# Patient Record
Sex: Female | Born: 1985 | Race: White | Hispanic: No | Marital: Single | Smoking: Never smoker
Health system: Southern US, Community
[De-identification: ages and names within clinical notes are randomized; demographics above are authoritative.]

## PROBLEM LIST (undated history)

## (undated) DIAGNOSIS — D509 Iron deficiency anemia, unspecified: Secondary | ICD-10-CM

## (undated) DIAGNOSIS — F410 Panic disorder [episodic paroxysmal anxiety] without agoraphobia: Secondary | ICD-10-CM

## (undated) DIAGNOSIS — F329 Major depressive disorder, single episode, unspecified: Secondary | ICD-10-CM

## (undated) DIAGNOSIS — B977 Papillomavirus as the cause of diseases classified elsewhere: Secondary | ICD-10-CM

## (undated) HISTORY — DX: Major depressive disorder, single episode, unspecified: F32.9

## (undated) HISTORY — DX: Iron deficiency anemia, unspecified: D50.9

## (undated) HISTORY — DX: Panic disorder (episodic paroxysmal anxiety): F41.0

## (undated) HISTORY — PX: MOUTH SURGERY: SHX715

## (undated) HISTORY — PX: WISDOM TOOTH EXTRACTION: SHX21

## (undated) HISTORY — DX: Papillomavirus as the cause of diseases classified elsewhere: B97.7

---
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
## 2004-01-29 ENCOUNTER — Other Ambulatory Visit: Admission: RE | Admit: 2004-01-29 | Discharge: 2004-01-29 | Payer: Self-pay | Admitting: Gynecology

## 2005-03-03 ENCOUNTER — Other Ambulatory Visit: Admission: RE | Admit: 2005-03-03 | Discharge: 2005-03-03 | Payer: Self-pay | Admitting: Gynecology

## 2006-03-10 ENCOUNTER — Other Ambulatory Visit: Admission: RE | Admit: 2006-03-10 | Discharge: 2006-03-10 | Payer: Self-pay | Admitting: Gynecology

## 2008-12-23 LAB — CONVERTED CEMR LAB: Pap Smear: ABNORMAL

## 2009-03-05 ENCOUNTER — Ambulatory Visit: Payer: Self-pay | Admitting: Gynecology

## 2009-03-05 ENCOUNTER — Encounter: Payer: Self-pay | Admitting: Gynecology

## 2009-03-05 ENCOUNTER — Other Ambulatory Visit: Admission: RE | Admit: 2009-03-05 | Discharge: 2009-03-05 | Payer: Self-pay | Admitting: Gynecology

## 2009-03-19 ENCOUNTER — Ambulatory Visit: Payer: Self-pay | Admitting: Gynecology

## 2009-07-31 ENCOUNTER — Ambulatory Visit: Payer: Self-pay | Admitting: Obstetrics and Gynecology

## 2009-08-12 ENCOUNTER — Ambulatory Visit: Payer: Self-pay | Admitting: Obstetrics and Gynecology

## 2009-10-23 ENCOUNTER — Ambulatory Visit: Payer: Self-pay | Admitting: Family Medicine

## 2009-10-23 DIAGNOSIS — F410 Panic disorder [episodic paroxysmal anxiety] without agoraphobia: Secondary | ICD-10-CM

## 2009-10-23 DIAGNOSIS — D509 Iron deficiency anemia, unspecified: Secondary | ICD-10-CM

## 2009-10-23 HISTORY — DX: Panic disorder (episodic paroxysmal anxiety): F41.0

## 2009-10-23 HISTORY — DX: Iron deficiency anemia, unspecified: D50.9

## 2009-12-24 ENCOUNTER — Ambulatory Visit: Payer: Self-pay | Admitting: Family Medicine

## 2009-12-24 LAB — CONVERTED CEMR LAB
AST: 15 units/L (ref 0–37)
Albumin: 4.1 g/dL (ref 3.5–5.2)
Alkaline Phosphatase: 37 units/L — ABNORMAL LOW (ref 39–117)
Basophils Absolute: 0 10*3/uL (ref 0.0–0.1)
Basophils Relative: 0.6 % (ref 0.0–3.0)
CO2: 28 meq/L (ref 19–32)
Eosinophils Absolute: 0.1 10*3/uL (ref 0.0–0.7)
Glucose, Bld: 74 mg/dL (ref 70–99)
HCT: 37.3 % (ref 36.0–46.0)
HDL: 65.4 mg/dL (ref >39.00)
Hemoglobin: 12.4 g/dL (ref 12.0–15.0)
Leukocytes, UA: NEGATIVE
Lymphocytes Relative: 31.1 % (ref 12.0–46.0)
Lymphs Abs: 1.3 10*3/uL (ref 0.7–4.0)
MCHC: 33.2 g/dL (ref 30.0–36.0)
Monocytes Relative: 8.7 % (ref 3.0–12.0)
Neutro Abs: 2.3 10*3/uL (ref 1.4–7.7)
Nitrite: NEGATIVE
Potassium: 4.3 meq/L (ref 3.5–5.1)
RBC: 4.02 M/uL (ref 3.87–5.11)
RDW: 11.8 % (ref 11.5–14.6)
Sodium: 139 meq/L (ref 135–145)
Specific Gravity, Urine: 1.02 (ref 1.000–1.030)
TSH: 1.02 microintl units/mL (ref 0.35–5.50)
Total CHOL/HDL Ratio: 2
Total Protein, Urine: NEGATIVE mg/dL
Total Protein: 6.7 g/dL (ref 6.0–8.3)
pH: 6 (ref 5.0–8.0)

## 2009-12-30 ENCOUNTER — Ambulatory Visit: Payer: Self-pay | Admitting: Family Medicine

## 2009-12-31 LAB — CONVERTED CEMR LAB: Hep B S Ab: POSITIVE — AB

## 2010-06-25 LAB — CONVERTED CEMR LAB: Pap Smear: NORMAL

## 2010-07-08 ENCOUNTER — Ambulatory Visit: Payer: Self-pay | Admitting: Family Medicine

## 2010-07-08 DIAGNOSIS — F329 Major depressive disorder, single episode, unspecified: Secondary | ICD-10-CM

## 2010-07-08 DIAGNOSIS — F3289 Other specified depressive episodes: Secondary | ICD-10-CM

## 2010-07-08 DIAGNOSIS — F4321 Adjustment disorder with depressed mood: Secondary | ICD-10-CM | POA: Insufficient documentation

## 2010-07-08 HISTORY — DX: Other specified depressive episodes: F32.89

## 2010-07-08 HISTORY — DX: Major depressive disorder, single episode, unspecified: F32.9

## 2010-08-11 ENCOUNTER — Ambulatory Visit: Payer: Self-pay | Admitting: Family Medicine

## 2010-11-06 ENCOUNTER — Ambulatory Visit
Admission: RE | Admit: 2010-11-06 | Discharge: 2010-11-06 | Payer: Self-pay | Source: Home / Self Care | Attending: Family Medicine | Admitting: Family Medicine

## 2010-11-06 DIAGNOSIS — J209 Acute bronchitis, unspecified: Secondary | ICD-10-CM | POA: Insufficient documentation

## 2010-11-24 NOTE — Assessment & Plan Note (Signed)
Summary: ?bronchitis//ccm pt rsc appt time/njr   Vital Signs:  Patient profile:   25 year old female Menstrual status:  regular Temp:     98.8 degrees F oral BP sitting:   128 / 78  (left arm) Cuff size:   regular  Vitals Entered By: Sid Falcon LPN (July 08, 2010 9:47 AM)  History of Present Illness: Patient seen for the following concerns  Acute new problem of right frontal sinus pressure with yellow to green nasal discharge and productive cough over the past week or so. Increased malaise. Intermittent headaches. No fever.  no signif cough.  History of panic disorder. Anxiety symptoms have improved with Zoloft. She is here to discuss some depressed mood daily for the past couple of months. No recent losses or other significant stressors. Work is going well. She has daily symptoms of depressed mood, low motivation, and occasional difficulty concentrating. No suicidal ideation.  Allergies (verified): No Known Drug Allergies  Past History:  Past Medical History: Last updated: 10/23/2009 Anemia-iron deficiency, hx of UTI Panic disorder PMH reviewed for relevance  Review of Systems  The patient denies anorexia, fever, weight loss, chest pain, headaches, and abdominal pain.    Physical Exam  General:  Well-developed,well-nourished,in no acute distress; alert,appropriate and cooperative throughout examination Head:  Normocephalic and atraumatic without obvious abnormalities. No apparent alopecia or balding. Ears:  External ear exam shows no significant lesions or deformities.  Otoscopic examination reveals clear canals, tympanic membranes are intact bilaterally without bulging, retraction, inflammation or discharge. Hearing is grossly normal bilaterally. Nose:  External nasal examination shows no deformity or inflammation. Nasal mucosa are pink and moist without lesions or exudates. Mouth:  Oral mucosa and oropharynx without lesions or exudates.  Teeth in good  repair. Neck:  minimally tender anterior cervical adenopathy bilaterally Lungs:  Normal respiratory effort, chest expands symmetrically. Lungs are clear to auscultation, no crackles or wheezes. Heart:  Normal rate and regular rhythm. S1 and S2 normal without gallop, murmur, click, rub or other extra sounds. Psych:  normally interactive, good eye contact, not anxious appearing, and subdued.     Impression & Recommendations:  Problem # 1:  DEPRESSION (ICD-311) add low dose Wellbutrin and follow up in one months. Her updated medication list for this problem includes:    Zoloft 100 Mg Tabs (Sertraline hcl) ..... Once daily    Bupropion Hcl 150 Mg Xr24h-tab (Bupropion hcl) ..... One by mouth once daily  Problem # 2:  SINUSITIS, ACUTE (ICD-461.9) Assessment: New  Her updated medication list for this problem includes:    Azithromycin 250 Mg Tabs (Azithromycin) .Marland Kitchen... 2 by mouth today then one by mouth once daily for 4 days  Complete Medication List: 1)  Zoloft 100 Mg Tabs (Sertraline hcl) .... Once daily 2)  Azithromycin 250 Mg Tabs (Azithromycin) .... 2 by mouth today then one by mouth once daily for 4 days 3)  Bupropion Hcl 150 Mg Xr24h-tab (Bupropion hcl) .... One by mouth once daily  Patient Instructions: 1)  Please schedule a follow-up appointment in 1 month.  Prescriptions: BUPROPION HCL 150 MG XR24H-TAB (BUPROPION HCL) one by mouth once daily  #30 x 5   Entered and Authorized by:   Evelena Peat MD   Signed by:   Evelena Peat MD on 07/08/2010   Method used:   Electronically to        [facility]  [address] [phone]* (retail)       [address]  [place], [place]  [address]       Ph: [phone] or [phone]       Fax: [phone]   RxID:   [id number] AZITHROMYCIN 250 MG TABS (AZITHROMYCIN) 2 by mouth today then one by mouth once daily for 4 days  #6 x 0   Entered and Authorized by:   Evelena Peat MD   Signed by:   Evelena Peat MD on 07/08/2010   Method  used:   [facility]        [facility]  [address] (retail)       [address]       [place], [place]  [address]       Ph: [phone] or [phone]       Fax: [phone]   RxID:   [id number] BUPROPION HCL 150 MG XR24H-TAB (BUPROPION HCL) one by mouth once daily  #30 x 5   Entered and Authorized by:   Evelena Peat MD   Signed by:   Evelena Peat MD on 07/08/2010   Method used:   [facility]        [facility]  [place] [address] (retail)       [address]       [place], [place]  [address]       Ph: [phone] or [phone]       Fax: [phone]   RxID:   [id number] AZITHROMYCIN 250 MG TABS (AZITHROMYCIN) 2 by mouth today then one by mouth once daily for 4 days  #6 x 0   Entered and Authorized by:   Evelena Peat MD   Signed by:   Evelena Peat MD on 07/08/2010   Method used:   [facility]        [facility]  [place] [address] (retail)       [address]       [place], [place]  [address]       Ph: [phone] or [phone]       Fax: [phone]   RxID:   [id number]  Pharmacy called, cancelled Rx at this location [facility] LPN  July 08, 2010 10:17 AM

## 2010-11-24 NOTE — Assessment & Plan Note (Signed)
Summary: ONE MTH ROV // RS   Vital Signs:  Patient profile:   25 year old female Menstrual status:  regular Weight:      133 pounds Temp:     98.3 degrees F oral BP sitting:   110 / 78  (left arm) Cuff size:   regular  Vitals Entered By: Sid Falcon LPN (August 11, 2010 8:43 AM)  History of Present Illness: Follow up depression.  Improved on Wellbutrin. Outlook and mood improved.  Sleep OK.  No side effects from med. More energy. Sinus infection improved from last visit.  Allergies (verified): No Known Drug Allergies  Past History:  Past Medical History: Last updated: 10/23/2009 Anemia-iron deficiency, hx of UTI Panic disorder PMH reviewed for relevance  Review of Systems      See HPI  Physical Exam  General:  Well-developed,well-nourished,in no acute distress; alert,appropriate and cooperative throughout examination Mouth:  Oral mucosa and oropharynx without lesions or exudates.  Teeth in good repair. Neck:  No deformities, masses, or tenderness noted. Lungs:  Normal respiratory effort, chest expands symmetrically. Lungs are clear to auscultation, no crackles or wheezes. Heart:  Normal rate and regular rhythm. S1 and S2 normal without gallop, murmur, click, rub or other extra sounds. Psych:  normally interactive, good eye contact, not anxious appearing, and not depressed appearing.     Impression & Recommendations:  Problem # 1:  DEPRESSION (ICD-311) Assessment Improved  Her updated medication list for this problem includes:    Zoloft 100 Mg Tabs (Sertraline hcl) ..... Once daily    Bupropion Hcl 150 Mg Xr24h-tab (Bupropion hcl) ..... One by mouth once daily  Complete Medication List: 1)  Zoloft 100 Mg Tabs (Sertraline hcl) .... Once daily 2)  Bupropion Hcl 150 Mg Xr24h-tab (Bupropion hcl) .... One by mouth once daily  Other Orders: Admin 1st Vaccine (04540) Flu Vaccine 49yrs + (98119)  Patient Instructions: 1)  Please schedule a follow-up appointment  in 6 months .    Orders Added: 1)  Est. Patient Level III [14782] 2)  Admin 1st Vaccine [90471] 3)  Flu Vaccine 31yrs + [95621]   Flu Vaccine Consent Questions     Do you have a history of severe allergic reactions to this vaccine? no    Any prior history of allergic reactions to egg and/or gelatin? no    Do you have a sensitivity to the preservative Thimersol? no    Do you have a past history of Guillan-Barre Syndrome? no    Do you currently have an acute febrile illness? no    Have you ever had a severe reaction to latex? no    Vaccine information given and explained to patient? yes    Are you currently pregnant? no    Lot Number:[id number]   Exp Date:04/24/2011   Site Given  Left Deltoid IM        .[email or web address]

## 2010-11-24 NOTE — Assessment & Plan Note (Signed)
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  Patient has no significant chronic medical problems. She has had prior history of panic disorder treated with Zoloft and well-controlled. Prior history of iron deficiency anemia. Patient has had PPD within the past year negative. Last tetanus 2005. She has received 2 doses of measles mumps rubella. Past history of clinical varicella. Flu shot earlier this year. Hepatitis B series in school but no shot record.  She gets GYN checkups elsewhere. No complaints today. Family history social history reviewed and no significant changes  Preventive Screening-Counseling & Management  Alcohol-Tobacco     Smoking Status: never  Allergies (verified): No Known Drug Allergies  Past History:  Past Medical History: Last updated: 10/23/2009 Anemia-iron deficiency, hx of UTI Panic disorder  Family History: Last updated: 10/23/2009 Family History of Arthritis  Mother RA Colon cancer, grandfather Breast cancer, grandmother hypertension, both grandparents Leukemia, grandfather  Social History: Last updated: 10/23/2009 Occupation:  [employer] Never Smoked Alcohol use-yes Regular exercise-yes  Risk Factors: Exercise: yes (10/23/2009)  Risk Factors: Smoking Status: never (12/30/2009)  Review of Systems  The patient denies anorexia, fever, weight loss, weight gain, vision loss, decreased hearing, hoarseness, chest  pain, syncope, dyspnea on exertion, peripheral edema, prolonged cough, headaches, hemoptysis, abdominal pain, melena, hematochezia, severe indigestion/heartburn, hematuria, incontinence, genital sores, muscle weakness, suspicious skin lesions, difficulty walking, depression, unusual weight change, abnormal bleeding, enlarged lymph nodes, and breast masses.    Physical Exam  General:  Well-developed,well-nourished,in no acute distress; alert,appropriate and cooperative throughout examination Head:  Normocephalic and atraumatic without obvious abnormalities. No apparent alopecia or balding. Eyes:  No corneal or conjunctival inflammation noted. EOMI. Perrla. Funduscopic exam benign, without hemorrhages, exudates or papilledema. Vision grossly normal. Ears:  External ear exam shows no significant lesions or deformities.  Otoscopic examination reveals clear canals, tympanic membranes are intact bilaterally without bulging, retraction, inflammation or discharge. Hearing is grossly normal bilaterally. Mouth:  Oral mucosa and oropharynx without lesions or exudates.  Teeth in good repair. Neck:  No deformities, masses, or tenderness noted. Lungs:  Normal respiratory effort, chest expands symmetrically. Lungs are clear to auscultation, no crackles or wheezes. Heart:  Normal rate and regular rhythm. S1 and S2 normal without gallop, murmur, click, rub or other extra sounds. Abdomen:  Bowel sounds positive,abdomen soft and non-tender without masses, organomegaly or hernias noted. Extremities:  No clubbing, cyanosis, edema, or deformity noted with normal full range of motion of all joints.   Neurologic:  No cranial nerve deficits noted. Station and gait are normal. Plantar reflexes are down-going bilaterally. DTRs are symmetrical throughout. Sensory, motor and coordinative functions appear intact. Skin:  Intact without suspicious lesions or rashes Cervical Nodes:  No lymphadenopathy noted Psych:  Oriented X3,  normally interactive, good eye contact, not anxious appearing, and not depressed appearing.  Impression & Recommendations:  Problem # 1:  Preventive Health Care (ICD-V70.0) patient needs proof of hepatitis B vaccination and will obtain hepatitis B surface antibody level.  Labs reviewed with pt and all OK.  discussed regular exercise.  Complete Medication List: 1)  Zoloft 100 Mg Tabs (Sertraline hcl) .... Once daily  Other Orders: T-Hepatitis B Surface Antibody ([id number]) Venipuncture (62952)

## 2010-11-26 NOTE — Assessment & Plan Note (Signed)
Summary: congestion//ccm   Vital Signs:  Patient profile:   25 year old female Menstrual status:  regular Weight:      134 pounds Temp:     98.0 degrees F oral BP sitting:   102 / 68  (left arm) Cuff size:   regular  Vitals Entered By: Sid Falcon LPN (November 06, 2010 2:55 PM)  History of Present Illness:  Cough      This is a 25 year old woman who presents with Cough.  The patient reports productive cough, but denies pleuritic chest pain, shortness of breath, wheezing, exertional dyspnea, fever, and hemoptysis.  Associated symtpoms include cold/URI symptoms.  The patient denies the following symptoms: sore throat, weight loss, acid reflux symptoms, and peripheral edema.    Allergies (verified): No Known Drug Allergies  Past History:  Past Medical History: Last updated: 10/23/2009 Anemia-iron deficiency, hx of UTI Panic disorder  Physical Exam  General:  Well-developed,well-nourished,in no acute distress; alert,appropriate and cooperative throughout examination Ears:  External ear exam shows no significant lesions or deformities.  Otoscopic examination reveals clear canals, tympanic membranes are intact bilaterally without bulging, retraction, inflammation or discharge. Hearing is grossly normal bilaterally. Mouth:  Oral mucosa and oropharynx without lesions or exudates.  Teeth in good repair. Neck:  No deformities, masses, or tenderness noted. Lungs:  Normal respiratory effort, chest expands symmetrically. Lungs are clear to auscultation, no crackles or wheezes. Heart:  Normal rate and regular rhythm. S1 and S2 normal without gallop, murmur, click, rub or other extra sounds.   Impression & Recommendations:  Problem # 1:  ACUTE BRONCHITIS (ICD-466.0) explained likely viral.  only start antibioitc if she develops fever or worsening symtpsm Her updated medication list for this problem includes:    Azithromycin 250 Mg Tabs (Azithromycin) .Marland Kitchen... 2 by mouth today then one by  mouth today then one by mouth once daily for 4 days  Complete Medication List: 1)  Zoloft 100 Mg Tabs (Sertraline hcl) .... Once daily 2)  Bupropion Hcl 150 Mg Xr24h-tab (Bupropion hcl) .... One by mouth once daily 3)  Nuvaring 0.12-0.015 Mg/24hr Ring (Etonogestrel-ethinyl estradiol) .... As directed 4)  Azithromycin 250 Mg Tabs (Azithromycin) .... 2 by mouth today then one by mouth today then one by mouth once daily for 4 days  Patient Instructions: 1)  Acute Bronchitis symptoms for less then 10 days are not  helped by antibiotics. Take over the counter cough medications. Call if no improvement in 5-7 days, sooner if increasing cough, fever, or new symptoms ( shortness of breath, chest pain) .  Prescriptions: AZITHROMYCIN 250 MG TABS (AZITHROMYCIN) 2 by mouth today then one by mouth today then one by mouth once daily for 4 days  #6 x 0   Entered and Authorized by:   Evelena Peat MD   Signed by:   Evelena Peat MD on 11/06/2010   Method used:   Print then Give to Patient   RxID:   [id number]    Orders Added: 1)  Est. Patient Level III [14782]    Preventive Care Screening  Pap Smear:    Date:  06/25/2010    Results:  normal

## 2011-02-10 ENCOUNTER — Encounter: Payer: Self-pay | Admitting: Family Medicine

## 2011-02-11 ENCOUNTER — Telehealth: Payer: Self-pay | Admitting: *Deleted

## 2011-02-11 ENCOUNTER — Ambulatory Visit: Payer: Self-pay | Admitting: Family Medicine

## 2011-02-11 DIAGNOSIS — Z0289 Encounter for other administrative examinations: Secondary | ICD-10-CM

## 2011-02-11 NOTE — Telephone Encounter (Signed)
No Show for 6 month F/U, LMTCB on cell to explain reason, reminded of $50 no show fee

## 2011-02-11 NOTE — Telephone Encounter (Signed)
No charge if valid explanation.

## 2011-02-16 NOTE — Telephone Encounter (Signed)
I have no record of pt call ing back with response yet

## 2011-05-12 ENCOUNTER — Other Ambulatory Visit: Payer: Self-pay | Admitting: Family Medicine

## 2011-06-16 ENCOUNTER — Encounter: Payer: Self-pay | Admitting: Internal Medicine

## 2011-06-16 ENCOUNTER — Ambulatory Visit (INDEPENDENT_AMBULATORY_CARE_PROVIDER_SITE_OTHER): Payer: PRIVATE HEALTH INSURANCE | Admitting: Internal Medicine

## 2011-06-16 VITALS — BP 102/68 | Temp 98.3°F | Wt 145.0 lb

## 2011-06-16 DIAGNOSIS — N390 Urinary tract infection, site not specified: Secondary | ICD-10-CM | POA: Insufficient documentation

## 2011-06-16 DIAGNOSIS — R300 Dysuria: Secondary | ICD-10-CM

## 2011-06-16 LAB — POCT URINALYSIS DIPSTICK
Blood, UA: NEGATIVE
Nitrite, UA: NEGATIVE
Spec Grav, UA: 1.025
Urobilinogen, UA: 0.2
pH, UA: 6

## 2011-06-16 MED ORDER — CEFUROXIME AXETIL 500 MG PO TABS: 500.0000 mg | ORAL_TABLET | Freq: Two times a day (BID) | ORAL | Status: AC

## 2011-06-16 NOTE — Progress Notes (Signed)
Addended by: Kern Reap B on: 06/16/2011 02:56 PM   Modules accepted: Orders

## 2011-06-16 NOTE — Assessment & Plan Note (Signed)
No fever.  Mild abd tenderness.  Treat with ceftin 500 bid x 5 days Increase fluid intake Patient advised to call office if symptoms persist or worsen.

## 2011-06-16 NOTE — Patient Instructions (Signed)
Please increase your fluid intake Take full course of antibiotic as directed. Please call our office if your symptoms do not improve or gets worse.

## 2011-06-16 NOTE — Progress Notes (Signed)
  Subjective:    Patient ID: Kristin Parks, female    DOB: September 25, 1986, 25 y.o.   MRN: [id number]  Urinary Tract Infection  This is a new problem. The current episode started in the past 7 days. The problem occurs intermittently. The problem has been unchanged. The quality of the pain is described as burning. The pain is mild. There has been no fever. There is no history of pyelonephritis. Associated symptoms include frequency and urgency. Pertinent negatives include no chills or possible pregnancy. She has tried increased fluids for the symptoms.      Review of Systems  Constitutional: Negative for chills.  Genitourinary: Positive for urgency and frequency.       Past Medical History  Diagnosis Date  . ANEMIA-IRON DEFICIENCY 10/23/2009  . DEPRESSION 07/08/2010  . PANIC DISORDER 10/23/2009    History   Social History  . Marital Status: Single    Spouse Name: N/A    Number of Children: N/A  . Years of Education: N/A   Occupational History  . Not on file.   Social History Main Topics  . Smoking status: Never Smoker   . Smokeless tobacco: Never Used  . Alcohol Use: Yes  . Drug Use:   . Sexually Active:    Other Topics Concern  . Not on file   Social History Narrative  . No narrative on file    No past surgical history on file.  Family History  Problem Relation Age of Onset  . Arthritis Mother   . Hypertension Maternal Grandmother   . Arthritis Maternal Grandmother   . Hypertension Maternal Grandfather   . Arthritis Maternal Grandfather   . Hypertension Paternal Grandmother   . Hypertension Paternal Grandfather   . Cancer Neg Hx     grandfather - colon , grandmother - breast , grandfather - leukemia    Not on File  Current Outpatient Prescriptions on File Prior to Visit  Medication Sig Dispense Refill  . etonogestrel-ethinyl estradiol (NUVARING) 0.12-0.015 MG/24HR vaginal ring Place 1 each vaginally every 28 (twenty-eight) days. Insert vaginally and leave in  place for 3 consecutive weeks, then remove for 1 week.       . sertraline (ZOLOFT) 100 MG tablet TAKE 1 TABLET BY MOUTH EVERY DAY  30 tablet  0  . buPROPion (WELLBUTRIN XL) 150 MG 24 hr tablet Take 150 mg by mouth daily.          BP 102/68  Temp(Src) 98.3 F (36.8 C) (Oral)  Wt 145 lb (65.772 kg)    Objective:   Physical Exam   Constitutional: Appears well-developed and well-nourished. No distress.  Cardiovascular: Normal rate, regular rhythm and normal heart sounds.  Exam reveals no gallop and no friction rub.   No murmur heard. Pulmonary/Chest: Effort normal and breath sounds normal.  No wheezes. No rales.  Abdominal: Soft. Mild right pelvic tenderness.        Assessment & Plan:

## 2011-06-29 ENCOUNTER — Other Ambulatory Visit: Payer: Self-pay | Admitting: Family Medicine

## 2011-07-08 ENCOUNTER — Encounter: Payer: Self-pay | Admitting: Women's Health

## 2011-07-08 ENCOUNTER — Ambulatory Visit: Payer: PRIVATE HEALTH INSURANCE | Admitting: Women's Health

## 2011-07-08 ENCOUNTER — Other Ambulatory Visit: Payer: BC Managed Care – PPO

## 2011-07-08 ENCOUNTER — Ambulatory Visit
Admission: RE | Admit: 2011-07-08 | Discharge: 2011-07-08 | Disposition: A | Discharge status: Home / Self Care | Payer: BC Managed Care – PPO | Source: Ambulatory Visit | Attending: Women's Health | Admitting: Women's Health

## 2011-07-08 ENCOUNTER — Ambulatory Visit (INDEPENDENT_AMBULATORY_CARE_PROVIDER_SITE_OTHER): Payer: BC Managed Care – PPO | Admitting: Women's Health

## 2011-07-08 DIAGNOSIS — B9689 Other specified bacterial agents as the cause of diseases classified elsewhere: Secondary | ICD-10-CM

## 2011-07-08 DIAGNOSIS — Z113 Encounter for screening for infections with a predominantly sexual mode of transmission: Secondary | ICD-10-CM

## 2011-07-08 DIAGNOSIS — O3400 Maternal care for unspecified congenital malformation of uterus, unspecified trimester: Secondary | ICD-10-CM

## 2011-07-08 DIAGNOSIS — O2690 Pregnancy related conditions, unspecified, unspecified trimester: Secondary | ICD-10-CM

## 2011-07-08 DIAGNOSIS — O209 Hemorrhage in early pregnancy, unspecified: Secondary | ICD-10-CM

## 2011-07-08 DIAGNOSIS — N912 Amenorrhea, unspecified: Secondary | ICD-10-CM

## 2011-07-08 DIAGNOSIS — N760 Acute vaginitis: Secondary | ICD-10-CM

## 2011-07-08 DIAGNOSIS — O9989 Other specified diseases and conditions complicating pregnancy, childbirth and the puerperium: Secondary | ICD-10-CM

## 2011-07-08 DIAGNOSIS — A499 Bacterial infection, unspecified: Secondary | ICD-10-CM

## 2011-07-08 LAB — US OB TRANSVAGINAL

## 2011-07-08 MED ORDER — METRONIDAZOLE 250 MG PO TABS: 250.0000 mg | ORAL_TABLET | Freq: Three times a day (TID) | ORAL | Status: AC

## 2011-07-08 NOTE — Progress Notes (Signed)
  Presents with a positive home U PT, positive U PT here in the office. LMP was July 29. Started bleeding on 9/3 and has been bleeding most of this week. Abdomen is soft nontender, external genitalia is within normal limits, speculum exam moderate amount of menses type blood with an odor was noted. GC/ Chlamydia culture was taken and is pending, wet prep is positive for BV. Bimanual no CMT no adnexal fullness or tenderness uterus was small.  Threatened AB and BV.  Plan: ABO and Rh, Quant is 99,200, an ultrasound to rule out ectopic versus IUP.   Ultrasound confirms an IUP. 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 Right uterus fluid-filled sac with echogenic debris 24 x 16 gestational sac with no fetal pole noted. Left uterus fetal pole seen with fetal heart rate with a normal shaped yoke sac subchorionic hematoma seen around the sac. Will repeat the ultrasound in 10 days. Pelvic rest, Flagyl 250 by mouth 3 times a day for 7 days prescription proper use was given. Prenatal vitamin daily, healthy pregnancy behaviors were reviewed, reviewed we no longer deliver, and will transfer her care after her next ultrasound.

## 2011-07-09 LAB — ABO AND RH: Rh Type: POSITIVE

## 2011-07-20 ENCOUNTER — Encounter: Payer: PRIVATE HEALTH INSURANCE | Admitting: Gynecology

## 2011-07-23 ENCOUNTER — Ambulatory Visit: Payer: BC Managed Care – PPO | Admitting: Women's Health

## 2011-07-23 ENCOUNTER — Encounter: Payer: Self-pay | Admitting: Women's Health

## 2011-07-23 ENCOUNTER — Ambulatory Visit (INDEPENDENT_AMBULATORY_CARE_PROVIDER_SITE_OTHER): Payer: BC Managed Care – PPO

## 2011-07-23 ENCOUNTER — Telehealth: Payer: Self-pay | Admitting: *Deleted

## 2011-07-23 ENCOUNTER — Other Ambulatory Visit: Payer: BC Managed Care – PPO

## 2011-07-23 ENCOUNTER — Ambulatory Visit (INDEPENDENT_AMBULATORY_CARE_PROVIDER_SITE_OTHER): Payer: BC Managed Care – PPO | Admitting: Women's Health

## 2011-07-23 DIAGNOSIS — N939 Abnormal uterine and vaginal bleeding, unspecified: Secondary | ICD-10-CM

## 2011-07-23 DIAGNOSIS — N898 Other specified noninflammatory disorders of vagina: Secondary | ICD-10-CM

## 2011-07-23 DIAGNOSIS — R110 Nausea: Secondary | ICD-10-CM

## 2011-07-23 DIAGNOSIS — O200 Threatened abortion: Secondary | ICD-10-CM

## 2011-07-23 LAB — US OB TRANSVAGINAL

## 2011-07-23 MED ORDER — ONDANSETRON HCL 8 MG PO TABS: 8.0000 mg | ORAL_TABLET | Freq: Three times a day (TID) | ORAL | Status: AC | PRN

## 2011-07-23 NOTE — Progress Notes (Signed)
  Presents for an ultrasound, spotting for the last 2 weeks that became heavier today.Approximately [redacted] weeks gestation. She was noted to have [redacted]w[redacted]d IUP with positive fetal heart tone, normal shaped yoke sac lt side of uterus and  a fluid-filled sac with echogenic debris on right side of uterus  07/08/2011.  Ultrasound today does show a 9 week 0 day gestational sac with a normal yolk sack, fetal pole with fetal heart activity 169. S=D. No free fluid noted. Complaint of nausea, states has vomited twice today, and states feels nauseous all day long. She is on BCalm vitamins, will try Zofran 8 mg every 8 hours as needed prescription proper use was given. Bland foods encouraged, she is aware of healthy behaviors in pregnancy. Will schedule new OB care, pelvic rest encouraged, she is A+ blood type. Copy of ultrasound given to take to first appointment.Marland Kitchen

## 2011-07-23 NOTE — Telephone Encounter (Signed)
Pt called early preg, bleeding still, states was told would bleed but didn't think this long, due to losing one twin. I talked to [place] verbally and she said to do [place] today. Pt informed and scheduled. Kw

## 2011-07-28 ENCOUNTER — Other Ambulatory Visit: Payer: BC Managed Care – PPO

## 2011-07-28 ENCOUNTER — Ambulatory Visit: Payer: BC Managed Care – PPO | Admitting: Women's Health

## 2011-11-06 ENCOUNTER — Encounter (HOSPITAL_COMMUNITY): Payer: Self-pay | Admitting: *Deleted

## 2011-11-06 ENCOUNTER — Inpatient Hospital Stay (HOSPITAL_COMMUNITY): Payer: BC Managed Care – PPO

## 2011-11-06 ENCOUNTER — Inpatient Hospital Stay (HOSPITAL_COMMUNITY)
Admission: AD | Admit: 2011-11-06 | Discharge: 2011-11-22 | DRG: 650 | Disposition: A | Discharge status: Home / Self Care | Payer: BC Managed Care – PPO | Source: Ambulatory Visit | Attending: Certified Nurse Midwife | Admitting: Certified Nurse Midwife

## 2011-11-06 DIAGNOSIS — Q5128 Other doubling of uterus, other specified: Secondary | ICD-10-CM

## 2011-11-06 DIAGNOSIS — O321XX0 Maternal care for breech presentation, not applicable or unspecified: Secondary | ICD-10-CM | POA: Diagnosis present

## 2011-11-06 DIAGNOSIS — O4590 Premature separation of placenta, unspecified, unspecified trimester: Secondary | ICD-10-CM | POA: Diagnosis present

## 2011-11-06 DIAGNOSIS — O3400 Maternal care for unspecified congenital malformation of uterus, unspecified trimester: Secondary | ICD-10-CM | POA: Diagnosis present

## 2011-11-06 DIAGNOSIS — O9903 Anemia complicating the puerperium: Secondary | ICD-10-CM | POA: Diagnosis not present

## 2011-11-06 DIAGNOSIS — K5900 Constipation, unspecified: Secondary | ICD-10-CM | POA: Diagnosis present

## 2011-11-06 DIAGNOSIS — O411090 Infection of amniotic sac and membranes, unspecified, unspecified trimester, not applicable or unspecified: Secondary | ICD-10-CM | POA: Diagnosis present

## 2011-11-06 DIAGNOSIS — O4290 Premature rupture of membranes, unspecified as to length of time between rupture and onset of labor, unspecified weeks of gestation: Principal | ICD-10-CM | POA: Diagnosis present

## 2011-11-06 DIAGNOSIS — D509 Iron deficiency anemia, unspecified: Secondary | ICD-10-CM | POA: Diagnosis not present

## 2011-11-06 LAB — URINALYSIS, ROUTINE W REFLEX MICROSCOPIC
Bilirubin Urine: NEGATIVE
Glucose, UA: NEGATIVE mg/dL
Ketones, ur: NEGATIVE mg/dL
Nitrite: NEGATIVE
Protein, ur: 100 mg/dL — AB
Specific Gravity, Urine: 1.01 (ref 1.005–1.030)
Urobilinogen, UA: 0.2 mg/dL (ref 0.0–1.0)
pH: 7.5 (ref 5.0–8.0)

## 2011-11-06 LAB — DIFFERENTIAL
Basophils Absolute: 0 10*3/uL (ref 0.0–0.1)
Basophils Relative: 0 % (ref 0–1)
Eosinophils Absolute: 0.1 10*3/uL (ref 0.0–0.7)
Eosinophils Relative: 1 % (ref 0–5)
Lymphocytes Relative: 12 % (ref 12–46)
Lymphs Abs: 1.2 10*3/uL (ref 0.7–4.0)
Monocytes Absolute: 0.6 10*3/uL (ref 0.1–1.0)
Monocytes Relative: 6 % (ref 3–12)
Neutro Abs: 8 10*3/uL — ABNORMAL HIGH (ref 1.7–7.7)
Neutrophils Relative %: 80 % — ABNORMAL HIGH (ref 43–77)

## 2011-11-06 LAB — URINE MICROSCOPIC-ADD ON

## 2011-11-06 LAB — CBC
HCT: 30.4 % — ABNORMAL LOW (ref 36.0–46.0)
Hemoglobin: 10.3 g/dL — ABNORMAL LOW (ref 12.0–15.0)
MCH: 30.6 pg (ref 26.0–34.0)
MCHC: 33.9 g/dL (ref 30.0–36.0)
MCV: 90.2 fL (ref 78.0–100.0)
Platelets: 313 10*3/uL (ref 150–400)
RBC: 3.37 MIL/uL — ABNORMAL LOW (ref 3.87–5.11)
RDW: 13.1 % (ref 11.5–15.5)
WBC: 9.9 10*3/uL (ref 4.0–10.5)

## 2011-11-06 LAB — CULTURE, BETA STREP (GROUP B ONLY)

## 2011-11-06 LAB — URINE CULTURE
Culture  Setup Time: 201301121720
Special Requests: NORMAL

## 2011-11-06 MED ORDER — PRENATAL MULTIVITAMIN CH
1.0000 | ORAL_TABLET | Freq: Every day | ORAL | Status: DC
  Administered 2011-11-06 – 2011-11-18 (×13): 1 via ORAL
  Filled 2011-11-06 (×15): qty 1

## 2011-11-06 MED ORDER — CALCIUM CARBONATE ANTACID 500 MG PO CHEW
2.0000 | CHEWABLE_TABLET | Freq: Four times a day (QID) | ORAL | Status: DC | PRN
  Administered 2011-11-06: 400 mg via ORAL
  Filled 2011-11-06: qty 2

## 2011-11-06 MED ORDER — SERTRALINE HCL 100 MG PO TABS
100.0000 mg | ORAL_TABLET | Freq: Every day | ORAL | Status: DC
  Administered 2011-11-06 – 2011-11-21 (×15): 100 mg via ORAL
  Filled 2011-11-06 (×18): qty 1

## 2011-11-06 MED ORDER — MAGNESIUM SULFATE 40 G IN LACTATED RINGERS - SIMPLE
2.0000 g/h | INTRAVENOUS | Status: DC
  Filled 2011-11-06: qty 500

## 2011-11-06 MED ORDER — ACETAMINOPHEN 325 MG PO TABS
650.0000 mg | ORAL_TABLET | ORAL | Status: DC | PRN
  Administered 2011-11-06 – 2011-11-17 (×8): 650 mg via ORAL
  Filled 2011-11-06 (×8): qty 2

## 2011-11-06 MED ORDER — MAGNESIUM SULFATE BOLUS VIA INFUSION
6.0000 g | Freq: Once | INTRAVENOUS | Status: AC
  Administered 2011-11-06: 6 g via INTRAVENOUS
  Filled 2011-11-06: qty 500

## 2011-11-06 MED ORDER — DOCUSATE SODIUM 100 MG PO CAPS
100.0000 mg | ORAL_CAPSULE | Freq: Three times a day (TID) | ORAL | Status: DC
  Administered 2011-11-06 – 2011-11-18 (×36): 100 mg via ORAL
  Filled 2011-11-06 (×36): qty 1

## 2011-11-06 MED ORDER — MAGNESIUM SULFATE BOLUS VIA INFUSION
4.0000 g | Freq: Once | INTRAVENOUS | Status: DC
  Filled 2011-11-06: qty 500

## 2011-11-06 MED ORDER — SODIUM CHLORIDE 0.9 % IV SOLN: 1.0000 g | Freq: Four times a day (QID) | INTRAVENOUS | Status: DC

## 2011-11-06 MED ORDER — FAMOTIDINE 20 MG PO TABS: 20.0000 mg | ORAL_TABLET | Freq: Every day | ORAL | Status: DC

## 2011-11-06 MED ORDER — FAMOTIDINE 20 MG PO TABS
20.0000 mg | ORAL_TABLET | Freq: Every day | ORAL | Status: DC
  Administered 2011-11-06 – 2011-11-22 (×15): 20 mg via ORAL
  Filled 2011-11-06 (×15): qty 1

## 2011-11-06 MED ORDER — PROMETHAZINE HCL 25 MG/ML IJ SOLN
12.5000 mg | Freq: Four times a day (QID) | INTRAMUSCULAR | Status: DC | PRN
  Administered 2011-11-06: 12.5 mg via INTRAVENOUS
  Filled 2011-11-06: qty 1

## 2011-11-06 MED ORDER — LACTATED RINGERS IV SOLN
INTRAVENOUS | Status: DC
  Administered 2011-11-06 – 2011-11-07 (×4): via INTRAVENOUS

## 2011-11-06 MED ORDER — AMPICILLIN SODIUM 2 G IJ SOLR
2.0000 g | Freq: Four times a day (QID) | INTRAMUSCULAR | Status: AC
  Administered 2011-11-06 – 2011-11-08 (×8): 2 g via INTRAVENOUS
  Filled 2011-11-06 (×8): qty 2000

## 2011-11-06 MED ORDER — SODIUM CHLORIDE 0.9 % IV SOLN
250.0000 mg | Freq: Four times a day (QID) | INTRAVENOUS | Status: AC
  Administered 2011-11-06 – 2011-11-08 (×8): 250 mg via INTRAVENOUS
  Filled 2011-11-06 (×8): qty 250

## 2011-11-06 MED ORDER — MAGNESIUM SULFATE 40 G IN LACTATED RINGERS - SIMPLE
2.0000 g/h | INTRAVENOUS | Status: DC
  Administered 2011-11-06 – 2011-11-07 (×2): 2 g/h via INTRAVENOUS
  Filled 2011-11-06 (×3): qty 500

## 2011-11-06 MED ORDER — ZOLPIDEM TARTRATE 10 MG PO TABS
10.0000 mg | ORAL_TABLET | Freq: Every evening | ORAL | Status: DC | PRN
  Administered 2011-11-06: 10 mg via ORAL
  Filled 2011-11-06: qty 1

## 2011-11-06 MED ORDER — BETAMETHASONE SOD PHOS & ACET 6 (3-3) MG/ML IJ SUSP
12.0000 mg | INTRAMUSCULAR | Status: AC
  Administered 2011-11-06 – 2011-11-07 (×2): 12 mg via INTRAMUSCULAR
  Filled 2011-11-06 (×2): qty 2

## 2011-11-06 MED ORDER — HYDROCORTISONE 2.5 % RE CREA
1.0000 "application " | TOPICAL_CREAM | Freq: Two times a day (BID) | RECTAL | Status: DC | PRN
  Filled 2011-11-06: qty 28.35

## 2011-11-06 NOTE — H&P (Signed)
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 female presenting for SROM this AM 0800.  Prenatal History: G1P0   EDC : 02/27/2012, Alternate EDD Entry  Prenatal care at [facility] since 10 weeks Primary provider [name] CNM  Prenatal course complicated by hx previa / episode of bleeding ~ 19-20 weeks  Prenatal Labs: ABO, Rh: A (09/13 1027) Positive Antibody:  negative Rubella:   Immune RPR:   NR HBsAg:   Negative HIV:   NR GBS:   pending 1 hr Glucola : not done yet Quad screen - negative   Medical / Surgical History :  Past medical history:  Past Medical History  Diagnosis Date  . ANEMIA-IRON DEFICIENCY 10/23/2009  . DEPRESSION 07/08/2010  . PANIC DISORDER 10/23/2009  . High risk HPV infection    Constipation in pregnancy  Past surgical history:  Past Surgical History  Procedure Date  . Mouth surgery   . Wisdom tooth extraction      Family History:  Family History  Problem Relation Age of Onset  . Arthritis Mother   . Hypertension Maternal Grandmother   . Arthritis Maternal Grandmother   . Hypertension Maternal Grandfather   . Arthritis Maternal Grandfather   . Hypertension Paternal Grandmother   . Hypertension Paternal Grandfather   . Cancer Neg Hx     grandfather - colon , grandmother - breast , grandfather - leukemia     Social History:  reports that she has quit smoking. She has never used smokeless tobacco. She reports that she does not drink alcohol or use illicit drugs.   Allergies: Review of patient's allergies indicates no known allergies.    Current Medications at time of admission:  Prenatal vitamin daily Zoloft 100 mg daily Miralax prn  Review of Systems: SROM clear fluid - large amount at 0800 No ctx / cramping  / backache / pain + FM very active No recent intercourse - none in 1 month No bleeding or spotting  Physical Exam: Alert and oriented x 3 Anxious/ tearful at times  Heart: RR Lungs: Clear and unlabored Abdomen: FH 23 cm with non-tender uterus / abdomen soft and non-tender / + BS active Extremities:no edema Genitalia / VE: defer bimanual exam / cervical exam - PPROM  FHR:155 - no decels TOCO: no ctx/no UI  SONO - cephalic / no previa - posterior placenta with adjacent clot [facility] - marginal bleed per radiologist) / AFI 9cm / cervical length 3.1   Assessment: 23 6/7 PPROM - guarded condition HX SCH / marginal bleed - residual clot / no active bleeding Hx depression - stable on zoloft  Plan:  Admit - inpatient until delivery IVF for hydration and MgSO4 for neuroprophylaxis Send GBS swab - Abx protocol for PPROM BMZ course - monitor FBS x 3 days Bedrest with bedside commode  NICU consult Continue zoloft Dr Seymour Bars on-call MD - updated with admission /pt status / plan of care - agrees  Patient seen, management  discussed, agree.  Genia Del MD  [name] 11/06/2011, 10:34 AM

## 2011-11-06 NOTE — Progress Notes (Signed)
Pooling noted by T.Bailey,CNM

## 2011-11-06 NOTE — ED Provider Notes (Signed)
History     Chief Complaint  Patient presents with  . Rupture of Membranes   HPI Gush of large amount of fluid this am in bed ~0800. No cramping or backache or ctx. No sexual intercourse or recent exams. Seen in past 2 weeks with episode of bleeding - dx complete previa. All bleeding resolved - cervical length 3.8 no beaking or funneling at that time. Placed on pelvic rest.  Past Medical History  Diagnosis Date  . ANEMIA-IRON DEFICIENCY 10/23/2009  . DEPRESSION 07/08/2010  . PANIC DISORDER 10/23/2009  . High risk HPV infection     Past Surgical History  Procedure Date  . Mouth surgery   . Wisdom tooth extraction     Family History  Problem Relation Age of Onset  . Arthritis Mother   . Hypertension Maternal Grandmother   . Arthritis Maternal Grandmother   . Hypertension Maternal Grandfather   . Arthritis Maternal Grandfather   . Hypertension Paternal Grandmother   . Hypertension Paternal Grandfather   . Cancer Neg Hx     grandfather - colon , grandmother - breast , grandfather - leukemia    History  Substance Use Topics  . Smoking status: Former Games developer  . Smokeless tobacco: Never Used  . Alcohol Use: No    Allergies: No Known Allergies   ROS Physical Exam   Blood pressure 95/61, pulse 84, temperature 99.3 F (37.4 C), resp. rate 16, height 5\' 5"  (1.651 m), weight 68.947 kg (152 lb), last menstrual period 05/23/2011.  Physical Exam  Anxious and tearful - upset  Lungs clear Abdomen soft non-tender / + BS Uterus non-tender  Sterile spec exam : large yellow pooling of AF - C/W gross ROM                                 Ferning slide obtained (+)                                 Nothing visible at cervical os - appears closed with steady trickle of AF                                 No bleeding  Defer bimanual - ROM & hx PREVIA  MAU Course  Procedures  Bedside sono - cervical length by transabdominal only  Assessment and Plan   23 6/7 well-dated with  hx PREVIA & bleeding 2 weeks ago Preterm SROM confirmed Stat sono prior to management decisions Consult with Dr Seymour Bars after Margo Aye  Agree with note above, Genia Del MD  [facility] 11/06/2011, 9:08 AM

## 2011-11-06 NOTE — Progress Notes (Signed)
Onset of gush of fluid around 8:00 still coming, mild cramping G1 24 weeks

## 2011-11-07 NOTE — Progress Notes (Signed)
Patient ID: Kristin Parks, female   DOB: Oct 31, 1985, 26 y.o.   MRN: [id number]  S: Feeling well      Reports twitchy feeling after IV phenergan for nausea / vomiting last pm - similar reaction in past to IV benadryl ( does fine with oral phenergan & oral benadryl)    No nausea this am  + FM  No pain / cramping / ctx   O:  VS: Blood pressure 90/49, pulse 88, temperature 98.1 F (36.7 C), temperature source Oral, resp. rate 18, height 5\' 5"  (1.651 m), weight 68.947 kg (152 lb), last menstrual period 05/23/2011, SpO2 98.00%.        FHR : baseline 150 with intermittent FHR tracings        Toco: occasional ctx ( 0-2 / hour) some episodic UI        Abdomen soft and nontender        Amniotic fluid leaking / no odor or color  Admission labs reviewed: cbc 9.9 / 10.3 / 30.4 / 313                                            Urine culture and GBS pending  A: PPROM without onset of preterm labor     [facility] / marginal placenta clot    Mild iron deficiency anemia / hx severe constipation - will add back iron supplement in 3-4 days  P: Continue current management plan      ABX and MgSO4 x 2-3 days      Urine cx and GBS pending      Re-assess tomorrow       NICU consult pending  BAILEY,TANYA 11/07/2011, 7:32 AM

## 2011-11-08 MED ORDER — MAGNESIUM HYDROXIDE 400 MG/5ML PO SUSP
30.0000 mL | Freq: Every day | ORAL | Status: DC
  Administered 2011-11-08 – 2011-11-22 (×13): 30 mL via ORAL
  Filled 2011-11-08 (×16): qty 30

## 2011-11-08 MED ORDER — ERYTHROMYCIN BASE 333 MG PO TBEC
333.0000 mg | DELAYED_RELEASE_TABLET | Freq: Three times a day (TID) | ORAL | Status: AC
  Administered 2011-11-08 – 2011-11-13 (×15): 333 mg via ORAL
  Filled 2011-11-08 (×15): qty 1

## 2011-11-08 MED ORDER — AMOXICILLIN 250 MG PO CAPS
250.0000 mg | ORAL_CAPSULE | Freq: Three times a day (TID) | ORAL | Status: AC
  Administered 2011-11-08 – 2011-11-13 (×15): 250 mg via ORAL
  Filled 2011-11-08 (×15): qty 1

## 2011-11-08 NOTE — Progress Notes (Signed)
Patient ID: Kristin Parks, female   DOB: Dec 13, 1985, 26 y.o.   MRN: [id number]  Hospital Day # 3 - 24 weeks 1/7 PPROM  S: Feeling well - good spirits     No contractions or cramping felt      No backache     Small LOF with BRP  - remains clear color     No bleeding or spotting    No BM since admission ( hx severe constipation)  O:  VS: Blood pressure 99/48, pulse 89, temperature 98.3 F (36.8 C), temperature source Oral, resp. rate 20, height 5\' 5"  (1.651 m), weight 68.947 kg (152 lb), last menstrual period 05/23/2011, SpO2 98.00%.        FHR : 150 baseline / moderate variability / no decels        Toco: rare ctx with occasional UI at night         Heart: RRR Lungs: Clear / unlabored Abdomen: + bowel sounds / soft / non-distended / uterus non-tender Extremities: no edema / SCD in place         A: PPROM at 24 1/7      SCH/ marginal bleed (21 wks) - residual clot     Completed 48 hour prophylaxis today     No evidence of onset of labor    HX severe constipation - no BM x 4 days       P: D/C IV antibiotics and transition to oral abx protocol x 5 days     D/C magnesium sulfate and IV     Will restart IV if any change in status     MFM and NICU consults are pending     Start MOM daily - push oral hydation / may add miralax if no BM in next 48 hours           Already taking Colace 100 TID scheduled     SONO - Thursday for AFI and cervical length      Avoid medications to worsen constipation - no TUMS or Zofran note to nursing staff      CNM will see patient daily - update MD on-call with status report    Kaylana Fenstermacher 11/08/2011, 9:31 AM

## 2011-11-09 ENCOUNTER — Inpatient Hospital Stay (HOSPITAL_COMMUNITY): Payer: BC Managed Care – PPO

## 2011-11-09 MED ORDER — PANTOPRAZOLE SODIUM 40 MG PO TBEC
40.0000 mg | DELAYED_RELEASE_TABLET | Freq: Every day | ORAL | Status: DC
  Administered 2011-11-09 – 2011-11-18 (×10): 40 mg via ORAL
  Filled 2011-11-09 (×12): qty 1

## 2011-11-09 NOTE — Consult Note (Addendum)
[facility]  Neonatal Medicine Consultation       11/09/2011    3:22 PM  I was called at the request of the patient's obstetric team Clare Gandy, CNM and Dr. Seymour Bars) to speak to this patient due to premature rupture of membranes at 24 weeks.  Mom has been here since 11/06/11, and has been treated with magnesium sulfate, betamethasone, antibiotics.  She has not developed labor.  I met with her and her partner.  I discussed possible outcomes for premature newborns as early as [redacted] weeks gestation.  I focused on respiratory distress, pulmonary hypoplasia, infection, intracranial bleeding, retinopathy, feeding, and neurodevelopmental outcome.  Mom plans to breast feed, and I discussed the advantages of this action.    Hopefully she will remain undelivered for many more weeks.  We will be happy to speak to her again during the coming weeks if she desires.  _____________________ Electronically Signed By: Angelita Ingles, MD Neonatologist

## 2011-11-09 NOTE — Progress Notes (Signed)
Patient ID: Nattalie Santiesteban, female   DOB: 09-24-1986, 26 y.o.   MRN: [id number] [name] Certified Nurse Midwife Signed Obstetrics Progress Notes 11/08/2011 9:31 AM    Hospital Day # 4 - 24 weeks 2/7 PPROM   S: Feeling well - good spirits  No contractions or cramping felt  No backache  Small LOF with BRP - remains clear color Pinkish Fluid leakage today   O: VSS- afebrile Tmax 99  FHR : 150 baseline / moderate variability / no decels  Toco: rare ctx with occasional UI at night   Heart: RRR  Lungs: Clear / unlabored  Abdomen: + bowel sounds / soft / non-distended / uterus non-tender  Extremities: no edema / SCD in place  Pelvic : deferred  A: PPROM at 24 2/7  SCH/ marginal bleed (21 wks) - residual clot  Completed 48 hour prophylaxis yesterday No evidence of onset of labor , no s/s of chorio HX severe constipation - had BM yesterday  P:Monitor signs and symptoms of Chorio Oral abx protocol x 5 days  Will restart IV if any change in status  MFM and NICU consults are pending today SONO - Today for AFI and cervical length with MFM Avoid medications to worsen

## 2011-11-09 NOTE — Progress Notes (Signed)
MFM Note  Kristin Parks is a 26 year old G1 Caucasian female at 24+2 weeks who was admitted on 01/12 due to spontaneous rupture of membranes. She experienced vaginal bleeding for ~ 5 days at 20-[redacted] weeks gestation due to a placenta previa and she was out of work for one week. The bleeding resolved and she returned to work but then had SROM at 23+6 weeks. There was no bleeding or contractions associated with the rupture of membranes. An ultrasound on admission revealed the previa to be resolved but there was a small subchorionic hemorrhage identified with low normal AFV.  She continues to leak amniotic fluid and had some more spotting this AM.   Since admission, she has received magnesium sulfate, antibiotics and a course of BMZ.  [place] today: severe oligohydramnios; frank breech presentation; long/closed cervix  VSS; afebrile; uterus NT; rare contractions; tracing reassuring  Assessment:  1) IUP at 24+2 weeks 2) PPROM 3) No s/s of intrauterine infection 4) Frank breech presentation 5) H/O placenta previa; - ? Now with abruption  Recommendations: 1) Agree with current management 2) Continue oral antibiotics for a course of 7-10 days 3) Monitor closely for s/s of IUI 4) Daily fetal heart tracings; follow-up with BPPs if concerning 5) Re bolus with magnesium sulfate if delivery is eminent for neuropx 6) Follow-up growth [place] in 2-3 weeks  I had a lengthy discussion with the patient, her husband and her mother tonight regarding with implications of early PPROM. All of their questions and concerns were addressed.  Please call us again anytime.  (Face-to-face consultation with patient: 30 min)

## 2011-11-10 LAB — HIV ANTIBODY (ROUTINE TESTING W REFLEX): HIV: NONREACTIVE

## 2011-11-10 NOTE — Progress Notes (Addendum)
Patient ID: Layken Beg, female   DOB: 06-Dec-1985, 26 y.o.   MRN: [id number]  Hospital day #5  S: Feeling well     + BM      Active FM / occasional LOF - small amt / no bleeding / denies ctx or cramping   O:  VS: Blood pressure 97/47, pulse 82, temperature 98.2 F (36.8 C), temperature source Oral, resp. rate 18, height 5\' 5"  (1.651 m), weight 64.184 kg (141 lb 8 oz), last menstrual period 05/23/2011, SpO2 98.00%.        FHR : reactive NST reviewed        Toco: rare ctx        Cervix : defer exam Lungs - clear  Abdomen - soft and non-tender Uterus - non-tender  GBS culture negative  A: PPROM without evidence of labor     Completed consults  P: Expectant management        BAILEY,TANYA 11/10/2011, 11:54 AM  MD note--  Pt seen, chart reviewed. Feels well, no further bloody fluid, no cramping or pain. Felt pelvic pressure when got up for restroom, but none since. VS stable, no fever/tachy/uterine tenderness or Ucs. Reportable symptoms reviewed. Will allow NICU visit since stable, no bleeding and no UCs or infection (s/p Neo and MFM consults). --V.Juliene Pina, MD

## 2011-11-10 NOTE — Progress Notes (Signed)
UR chart review completed.  

## 2011-11-11 NOTE — Progress Notes (Signed)
HD # 6  S: Feels well, in good spirits denies abd tenderness / cramping / ctx.  fluid leakage clear, small amount (+) BM x 2, loose stool yesterday Did NICU tour yesterday  O: BP 103/48  Pulse 93  Temp(Src) 97.9 F (36.6 C) (Oral)  Resp 20  Ht 5\' 5"  (1.651 m)  Wt 64.184 kg (141 lb 8 oz)  BMI 23.55 kg/m2  SpO2 98%  LMP 05/23/2011  AAO x 3, NAD CV RRR Lungs CTAB Abd: soft NT, gravid uterus    RUE:AVWUJWJX: 140 bpm, occasional variable (severe oligo on last scan) Toco: occasional, mild SVE: deferred  A/P- 26 y.o. admitted with PPROM, no active labor  Preterm labor management: S/P BMZ, ABX, mag Sulfate MFM and neo consult completed Dating:  [redacted]w[redacted]d  FWB:  reassuring  Expectant management MOM PRN if constipation reoccurs   Reviewed S/S IUI.  PAUL,DANIELA  11/11/2011 11:17 AM

## 2011-11-12 NOTE — Progress Notes (Signed)
Hospital day # 6 pregnancy at [redacted]w[redacted]d  PPROM  S: well, reports good fetal activity      Contractions:none, minor irritability      Vaginal bleeding:none now       Vaginal discharge: no significant change, small amount of AF leak.  O: BP 92/56  Pulse 89  Temp(Src) 98.4 F (36.9 C) (Oral)  Resp 18  Ht 5\' 5"  (1.651 m)  Wt 64.184 kg (141 lb 8 oz)  BMI 23.55 kg/m2  SpO2 98%  LMP 05/23/2011      Fetal tracings:Fetal heart variability: moderate, no deceleration.  FHR 135/min reviewed and reassuring      Uterus non-tender      Extremities: no significant edema and no signs of DVT  A: [redacted]w[redacted]d with PPROM, no chorio, no PTL.  Fetal well-being reassuring.     unchanged  P: continue current plan of care  Michae Grimley,MARIE-LYNE  MD 11/12/2011 12:14 PM

## 2011-11-12 NOTE — Progress Notes (Signed)
24 5/[redacted] weeks gestation, with PROM.  Height  65" Weight 141 Lbs ( last weight in Dr.'s office 155 Lbs. Pt feels as if this weight is correct)  pre-pregnancy weight 144 Lbs.Pre-pregnancy  BMI 23.5  IBW 125 Lbs  Total weight gain 11 lbs. Weight gain goals 25-35 Lbs.   Estimated needs: 19-2100  kcal/day, 70-80  grams protein/day, 2 liters fluid/day Regular diet tolerated well, appetite good. Will change diet order to Antenatal regular to allow snacks TID Current diet prescription will provide for increased needs. No abnormal nutrition related labs  Nutrition Dx: Increased nutrient needs r/t pregnancy and fetal growth requirements aeb [redacted] weeks gestation.  No educational needs assessed at this time.

## 2011-11-13 NOTE — Progress Notes (Signed)
Patient ID: Kristin Parks, female   DOB: [birth year], 26 y.o.   MRN: [id number] Arlan Organ, CNM Certified Nurse Midwife Signed  Progress Notes 11/11/2011 11:02 AM   HD # 8 S:  Feels well, in good spirits . Good FM. No bleeding denies abd tenderness / cramping / ctx.  fluid leakage clear, small amount  In general but increased today. (+) BM   O:VSS- afebrile Tmax 98.5 AAO x 3, NAD  CV RRR  Lungs CTAB  Abd: soft NT, gravid uterus  Ext : neg c/c/e Neuro : nonfocal Skin : intact  WUJ:WJXBJYNW: 140 bpm, occasional variable (severe oligo on last scan)  Good BTBV, occ 10x10 accel. Toco: occasional, mild contraction SVE: deferred   A/P- PPROM at 24 weeks 6 days- no s/s chorio Breech malpresentation Preterm labor management: S/P BMZ, ABX, mag Sulfate  MFM and neo consult completed  FHT reassuring with TID monitoring. No  recurrent decels. Expectant management at this time MOM PRN if constipation reoccurs

## 2011-11-14 NOTE — Progress Notes (Signed)
HD # 9  S: Doing well, reports good FM. Leaking pink tinge fluid, slightly increased today Denies abd tenderness / cramping / ctx / chills  O:  Filed Vitals:   11/13/11 1610 11/13/11 1935 11/13/11 2225 11/14/11 0804  BP: 97/48 94/49 100/49 100/52  Pulse: 96 68 84 69  Temp: 98.1 F (36.7 C) 98.4 F (36.9 C) 98.2 F (36.8 C) 97.6 F (36.4 C)  TempSrc: Oral Oral Oral Oral  Resp: 20 18 18 20   Height:      Weight:      SpO2:         ZOX:WRUEAVWU: 135 bpm, mild occasional variables, moderate variability Toco: None SVE: deferred  A/P- 26 y.o. admitted with PPROM, no labor  Dating:  [redacted]w[redacted]d Preterm labor management: S/P BMZ, Mag Sulfate, ABX per PPROM protocol, neo and MFM consults done GBS negative, no evidence of developing chorio, afebrile  PNL Needed:  1GTT, RPR at 28 wks FWB:  Category 1, EFM tracing appropriate for gestational age PTL:  No ctx JWJ:XBJYNW per last sono / C-section with labor or if developing chorio   Continue current management  Change toco q shift x 30 min PT eval for long term bedrest Plan rpt AFI 11/16/2011 and PRN if persistent variables  Kristin Parks 11/14/2011 1:08 PM

## 2011-11-14 NOTE — Progress Notes (Signed)
Patient ID: Kristin Parks, female   DOB: 04/07/1986, 26 y.o.   MRN: [id number] HD # 9  S:  Feels well. Good FM. No bleeding  denies abd tenderness / cramping / ctx.  fluid leakage clear, small amount In general but increased and pinkish today.  (+) BM   O:VSS- afebrile Tmax 98.5  AAO x 3, NAD  CV RRR  Lungs CTAB  Abd: soft NT, gravid uterus  Ext : neg c/c/e  Neuro : nonfocal  Skin : intact   DJM:EQASTMHD: 140 bpm, occasional variable (severe oligo on last scan)  Good BTBV, occ 10x10 accel.  Toco: occasional, mild contraction  SVE: deferred   A/P- PPROM at 25 weeks 0 days- no s/s chorio  Breech malpresentation - cesarean section if labor ensues Would rpt neuroprophylaxis if contraction frequency increases. Preterm labor management: S/P BMZ, ABX, mag Sulfate  MFM and NICU consult completed  FHT reassuring with TID monitoring. No recurrent decels.  Expectant management at this time  MOM PRN if constipation  Recommend BPP q week.

## 2011-11-15 ENCOUNTER — Inpatient Hospital Stay (HOSPITAL_COMMUNITY): Payer: BC Managed Care – PPO

## 2011-11-15 NOTE — Progress Notes (Signed)
Patient ID: Cornelious Diven, female   DOB: 16-Dec-1985, 26 y.o.   MRN: [id number] Subjective: Feels well, light pink/dark fluid discharge. No active bleeding. No contractions/ back pain. No UTI/ URI symptoms. No abdo pain/ nausea/vomiting. Constipation better. No depression. Feels fetal movements.   Objective: Vital signs in last 24 hours: Temp:  [98 F (36.7 C)-98.1 F (36.7 C)] 98 F (36.7 C) (01/21 0738) Pulse Rate:  [74-77] 75  (01/21 0738) Resp:  [18-20] 18  (01/21 0738) BP: (91-96)/(46-55) 91/46 mmHg (01/21 0738) Weight change:    Physical exam:  A&O x 3, no acute distress. Pleasant Abdo soft, non tender, non acute, no palpable contractions.  Extr no edema/ tenderness, using SCDs intermittently. Pelvic deferred.  FHT 145/ moderate variability / + accels 10x10/ rare mild variable decels.  Toco None  Studies/Results: Plan BPP wk'ly and growth sono next wk (2 wk interval growth, last growth sono 11/09/11)  Assessment/Plan:  LOS: 9 days  Stable, PPROM at 25.1/7 wks (admitted at 23.5/7 wks), BREECH S/p BTMZ for fetal lung maturity S/p Magnesium for neuroprophylaxis, will repeat if imminent labor S/p Antibiotics for latency, no e/o chorioamnionitis S/p MFM, NICU consult and NICU visit.  NST reactive, plan wk'ly BPP and continue daily fetal monitoring as scheduled.  Aware of need for c-section for delivery, plan growth sono and position in1 wk.  CNM primary, with MD back up.   Kiyra Slaubaugh R 11/15/2011, 10:07 AM

## 2011-11-15 NOTE — Progress Notes (Signed)
UR chart review completed.  

## 2011-11-16 NOTE — Progress Notes (Signed)
Patient ID: Kristin Parks, female DOB: 1986-04-21, 26 y.o. MRN: [id number]    HD #10  Subjective:  Feels well, light pink/dark fluid discharge as before No active bleeding. No contractions/ back pain.  No UTI/ URI symptoms. No abdo pain/ nausea/vomiting.  Constipation better. No depression. Feels fetal movements.   Objective:  Vital signs in last 24 hours: Filed Vitals:   11/15/11 2048 11/15/11 2130 11/15/11 2230 11/16/11 0820  BP:    92/53  Pulse:    80  Temp:    98.4 F (36.9 C)  TempSrc:      Resp: 16 16 16 16   Height:      Weight:      SpO2:        Physical exam:  A&O x 3, no acute distress. Pleasant  Abdo soft, non tender, non acute, no palpable contractions.  Extr no edema/ tenderness, using SCDs intermittently.  Pelvic deferred. (+) pink light d/c on pad, small amount   FHT 145/ moderate variability / + accels 10x10/ rare mild variable decels.  Toco : no ctx  Studies/Results:  BPP yesterday 6/8, oligo (largest pocket 2.4 cm) no fetal breathing movements  Assessment/Plan: LOS: 10 days  Stable, PPROM at 25 2/7 wks (admitted at 23.5/7 wks), BREECH  S/p BTMZ for fetal lung maturity  S/p Magnesium for neuroprophylaxis, will repeat if imminent labor  S/p Antibiotics for latency, no e/o chorioamnionitis  (T-max 98.4) S/p MFM, NICU consult and NICU visit.   NST reactive, plan repeat BPP in 2 days and continue daily fetal monitoring as scheduled.  Aware of need for c-section for delivery, plan growth sono and position in1 wk.  CNM primary, with MD back up.  PT consult for lengthy bedrest pending  PAUL,DANIELA  11/16/2011 1:18 PM

## 2011-11-17 ENCOUNTER — Inpatient Hospital Stay (HOSPITAL_COMMUNITY): Payer: BC Managed Care – PPO

## 2011-11-17 LAB — CBC
HCT: 33.7 % — ABNORMAL LOW (ref 36.0–46.0)
Hemoglobin: 11.1 g/dL — ABNORMAL LOW (ref 12.0–15.0)
MCH: 30.1 pg (ref 26.0–34.0)
MCHC: 32.9 g/dL (ref 30.0–36.0)
RBC: 3.69 MIL/uL — ABNORMAL LOW (ref 3.87–5.11)

## 2011-11-17 LAB — DIFFERENTIAL
Eosinophils Absolute: 0.1 10*3/uL (ref 0.0–0.7)
Lymphs Abs: 2 10*3/uL (ref 0.7–4.0)
Monocytes Absolute: 0.9 10*3/uL (ref 0.1–1.0)
Monocytes Relative: 6 % (ref 3–12)
Neutrophils Relative %: 80 % — ABNORMAL HIGH (ref 43–77)

## 2011-11-17 MED ORDER — LACTATED RINGERS IV SOLN
INTRAVENOUS | Status: DC
  Administered 2011-11-17 – 2011-11-19 (×9): via INTRAVENOUS

## 2011-11-17 MED ORDER — MAGNESIUM SULFATE 40 G IN LACTATED RINGERS - SIMPLE
1.0000 g/h | INTRAVENOUS | Status: DC
  Administered 2011-11-17: 4 g via INTRAVENOUS
  Administered 2011-11-18: 1 g/h via INTRAVENOUS
  Filled 2011-11-17 (×2): qty 500

## 2011-11-17 MED ORDER — MAGNESIUM SULFATE BOLUS VIA INFUSION
4.0000 g | Freq: Once | INTRAVENOUS | Status: DC
  Filled 2011-11-17: qty 500

## 2011-11-17 NOTE — Progress Notes (Signed)
Patient ID: Kristin Parks, female   DOB: 23-Jul-1986, 26 y.o.   MRN: [id number] HD #11 S:  Feels well. Good FM. Inc bleeding today on pad. Slight inc in abdominal tenderness. Continued fluid leakage.  (+) BM   O:VSS- afebrile Tmax 99.1 AAO x 3, NAD  CV RRR  Lungs CTAB  Abd: soft NT, gravid uterus slightly tender to palpation Ext : neg c/c/e  Neuro : nonfocal  Skin : intact  SVE: deferred  WBC 15.3 today BPP 4/8 today  WUJ:WJXBJYNW: 140 bpm, occasional variable (severe oligo on last scan)  Good BTBV, occ 10x10 accel.  Toco: occasional, mild contraction noted    A/P- PPROM at 25 weeks 3days-  Closely monitor s/s of Chorio Breech malpresentation - cesarean section if labor ensues  Rpt neuroprophylaxis now.  Preterm labor management: S/P BMZ, ABX,  MFM and NICU consult completed  FHT reassuring with TID monitoring. No recurrent decels. Discussed with MFM today and will continue  Expectant management at this time. Continuous EFM and Toco now. Will repeat BPP in 8 hrs.

## 2011-11-17 NOTE — Progress Notes (Signed)
11/17/11 1200  Clinical Encounter Type  Visited With Patient and family together  Visit Type Initial  Recommendations Continue spiritual and emotional support.  Spiritual Encounters  Spiritual Needs Emotional    Visited with Yolette, her boyfriend, and her grandmother from [place], all of whom were in good spirits.  Humor, family support, and encouragement from her coworkers all assist in coping.  A self-described "people person," Venesa was receptive to pastoral presence and welcomes continued support through her stay in antenatal and anticipated NICU time.   She is aware of ongoing chaplain availability, and Spiritual Care will follow to provide encouragement, emotional support, and space to process anxieties and hopes.  Avis Epley, [place] Chaplain [phone]

## 2011-11-17 NOTE — Progress Notes (Signed)
HD #11  PPROM x 11 days, EGA 25w 3d  S: C/O bright red vaginal bleed, filled one pad in early morning and blood in toilet after void.  Reports increased cramping during the night, (+) fetal movement, denies abdominal tenderness.  No SOB / dizziness / N/V.  O:  VSS, temp 99.1  AAOx3 NAD CV: RRR Pulm:CTAB Abd: mild tender to pal, + Ctx / mild Pelvic: deferred (+) moderate amount bright red mixed with AF, no clots Ext: ne edema, neg Homan's  FHR: 135, mod variability, no decel's, (+) 10x10 accel's Toco: poor tracing of ctx, but palp Q 3-4 min.  A/P IUP at 25 3/7 wks, PPROM, BREECH Low grade temp this AM and increased VB  hx SCH with bleed earlier in pregnancy 20-21 wks  Developing chorio vs SCH bleed  Restart Mag Sulphate for neuroprotection CBC this AM Continue close obs, for C/S if delivery warranted   Dr. Billy Coast consult and agrees.   PAUL,DANIELA 11/17/2011 7:47 AM

## 2011-11-17 NOTE — Progress Notes (Signed)
Met w/ pt and husband to introduce myself and review care plan  No fevers, less crampy that earlier, no pain, good FM, some change to d/c, spotting slowed.  Filed Vitals:   11/17/11 1201 11/17/11 1231 11/17/11 1301 11/17/11 1408  BP: 95/56  93/55 80/38  Pulse: 87  99 86  Temp:      TempSrc:      Resp: 16  16 18   Height:      Weight:  65.273 kg (143 lb 14.4 oz)    SpO2:       Gen: well appearing Abd: gravid, NT Toco: no clear ctx FH: AGA, no variables at this time  A/P: 25 wks PPROM w/ some new bleeding and inc WBC - clinically no chorio, abd NT, WBC concerning (? From BMZ last wk), will repeat in AM. Cont to check temp q 2 hrs. - FWB. AGA NST, overall reasurring. BPP 4/8 (-2 fluid- PPROM, -2 breathing- 25 kws). Cont monitoring for now - MOD-d/w pt would need classical c/s.  Jameila Keeny A. 11/17/2011 3:00 PM

## 2011-11-18 LAB — CBC
HCT: 28.2 % — ABNORMAL LOW (ref 36.0–46.0)
Hemoglobin: 9.5 g/dL — ABNORMAL LOW (ref 12.0–15.0)
MCHC: 33.7 g/dL (ref 30.0–36.0)
RBC: 3.11 MIL/uL — ABNORMAL LOW (ref 3.87–5.11)

## 2011-11-18 LAB — DIFFERENTIAL
Basophils Relative: 0 % (ref 0–1)
Lymphocytes Relative: 13 % (ref 12–46)
Lymphs Abs: 1.6 10*3/uL (ref 0.7–4.0)
Monocytes Absolute: 0.7 10*3/uL (ref 0.1–1.0)
Monocytes Relative: 6 % (ref 3–12)
Neutro Abs: 9.6 10*3/uL — ABNORMAL HIGH (ref 1.7–7.7)
Neutrophils Relative %: 80 % — ABNORMAL HIGH (ref 43–77)

## 2011-11-18 MED ORDER — POLYSACCHARIDE IRON 150 MG PO CAPS
150.0000 mg | ORAL_CAPSULE | Freq: Every day | ORAL | Status: DC
  Administered 2011-11-18 – 2011-11-20 (×2): 150 mg via ORAL
  Filled 2011-11-18 (×3): qty 1

## 2011-11-18 MED ORDER — SIMETHICONE 80 MG PO CHEW
80.0000 mg | CHEWABLE_TABLET | ORAL | Status: DC | PRN
  Administered 2011-11-18: 80 mg via ORAL

## 2011-11-18 MED ORDER — POLYETHYLENE GLYCOL 3350 17 G PO PACK
17.0000 g | PACK | Freq: Every day | ORAL | Status: DC
  Administered 2011-11-18: 17 g via ORAL
  Filled 2011-11-18 (×3): qty 1

## 2011-11-18 NOTE — Progress Notes (Signed)
FHR strip reviewed by Valinda Hoar RN

## 2011-11-18 NOTE — Progress Notes (Addendum)
Patient ID: Kristin Parks, female   DOB: 1986/02/14, 26 y.o.   MRN: [id number]  Antenatal Visit - 25 4/7 weeks PPROM - hospital day # 12  S: Feeling ok - better than yesterday     No ctx or cramping     No bleeding - just brown spotting - scant amount     Active FM     BM every 4 days despite colace and MOM       O:  VS: Blood pressure 90/53, pulse 86, temperature 97.6 F (36.4 C), temperature source Oral, resp. rate 18, height 5\' 5"  (1.651 m), weight 65.273 kg (143 lb 14.4 oz), last menstrual period 05/23/2011, SpO2 97.00%.  Alert and oriented x 3 / NAD / no pain Heart RRR Lungs clear and unlabored Abdomen + BS / soft and non-distended / uterus non-tender  Lab: CBC : 12.1 / 9.5 / 28.2 / 298         FHR : baseline 130 / moderate variability / + accels 150-160 range                    Occasional variable decels nadir 100 x 20-50 seconds         Toco: contractions 0-4 per hour / occasional episodes UI         Cervix : defer exam        Peripad - brown spotting reported / clean & dry pad  A: PPROM at 25 4/7 with oligiohydramnios      Chronic marginal placenta bleed - no active bleeding today      Iron deficiency anemia      Chronic constipation      No evidence of chorioamnionitis or active labor at this time  P:   Continue current management - OFF magnesium today per MD order Update MD on-call with status / rounds this am  Discussed delivery route again with patient - early gestational age / malpresentation / uterine size  would necessitate classical cesarean section. With hgb at 9.8 - increased risk for severe anemia with acute blood loss - recommend starting iron and adding Miralax to prevention additional constipation symptoms. Understands and agrees. Desires to meet Dr Cherly Hensen - has met all over MDs   Reviewed signs to notify staff - increase cramping / pressure / ctx / any red bleeding again / soreness or pain in abdomen over uterus / any odor to amniotic fluid.  Guarded  status        BAILEY,TANYA 11/18/2011, 10:44 AM  Pt also seen by me. Feels better than yesterday, no cramping, no ctx, no active bleeding, no fevers, good fetal movement. Vitals, labs reviewed.  A/P: no evidence labor, chorio. Bleeding likely from marginal previa and possibly abruption. Will stop Mag, move to tid fetal monitoring.  Keandrea Tapley A. 11/18/2011 1:36 PM

## 2011-11-19 ENCOUNTER — Inpatient Hospital Stay (HOSPITAL_COMMUNITY): Payer: BC Managed Care – PPO | Admitting: Anesthesiology

## 2011-11-19 ENCOUNTER — Encounter (HOSPITAL_COMMUNITY): Payer: Self-pay | Admitting: *Deleted

## 2011-11-19 ENCOUNTER — Encounter (HOSPITAL_COMMUNITY): Payer: Self-pay | Admitting: Anesthesiology

## 2011-11-19 ENCOUNTER — Encounter (HOSPITAL_COMMUNITY)
Admission: AD | Disposition: A | Discharge status: Home / Self Care | Payer: Self-pay | Source: Ambulatory Visit | Attending: Obstetrics & Gynecology

## 2011-11-19 ENCOUNTER — Other Ambulatory Visit: Payer: Self-pay | Admitting: Obstetrics and Gynecology

## 2011-11-19 LAB — CBC
HCT: 32.3 % — ABNORMAL LOW (ref 36.0–46.0)
Hemoglobin: 10.6 g/dL — ABNORMAL LOW (ref 12.0–15.0)
MCH: 30.1 pg (ref 26.0–34.0)
MCHC: 32.8 g/dL (ref 30.0–36.0)
MCV: 91.8 fL (ref 78.0–100.0)
Platelets: 320 10*3/uL (ref 150–400)
RBC: 3.52 MIL/uL — ABNORMAL LOW (ref 3.87–5.11)
RDW: 13 % (ref 11.5–15.5)
WBC: 14.1 10*3/uL — ABNORMAL HIGH (ref 4.0–10.5)

## 2011-11-19 LAB — DIFFERENTIAL
Basophils Absolute: 0 10*3/uL (ref 0.0–0.1)
Basophils Relative: 0 % (ref 0–1)
Eosinophils Absolute: 0.1 10*3/uL (ref 0.0–0.7)
Eosinophils Relative: 1 % (ref 0–5)
Lymphocytes Relative: 15 % (ref 12–46)
Lymphs Abs: 2.1 10*3/uL (ref 0.7–4.0)
Monocytes Absolute: 0.9 10*3/uL (ref 0.1–1.0)
Monocytes Relative: 7 % (ref 3–12)
Neutro Abs: 11 10*3/uL — ABNORMAL HIGH (ref 1.7–7.7)
Neutrophils Relative %: 78 % — ABNORMAL HIGH (ref 43–77)

## 2011-11-19 SURGERY — Surgical Case
Anesthesia: Spinal | Site: Abdomen | Wound class: Clean Contaminated

## 2011-11-19 MED ORDER — FENTANYL CITRATE 0.05 MG/ML IJ SOLN
50.0000 ug | Freq: Once | INTRAMUSCULAR | Status: AC
  Administered 2011-11-19: 50 ug via INTRAVENOUS

## 2011-11-19 MED ORDER — PRENATAL MULTIVITAMIN CH
1.0000 | ORAL_TABLET | Freq: Every day | ORAL | Status: DC
  Administered 2011-11-20 – 2011-11-22 (×3): 1 via ORAL
  Filled 2011-11-19 (×3): qty 1

## 2011-11-19 MED ORDER — IBUPROFEN 600 MG PO TABS
600.0000 mg | ORAL_TABLET | Freq: Four times a day (QID) | ORAL | Status: DC
  Administered 2011-11-20 – 2011-11-22 (×10): 600 mg via ORAL
  Filled 2011-11-19 (×10): qty 1

## 2011-11-19 MED ORDER — SODIUM CHLORIDE 0.9 % IJ SOLN: 3.0000 mL | INTRAMUSCULAR | Status: DC | PRN

## 2011-11-19 MED ORDER — DIPHENHYDRAMINE HCL 50 MG/ML IJ SOLN: 25.0000 mg | INTRAMUSCULAR | Status: DC | PRN

## 2011-11-19 MED ORDER — EPHEDRINE SULFATE 50 MG/ML IJ SOLN
INTRAMUSCULAR | Status: DC | PRN
  Administered 2011-11-19: 15 mg via INTRAVENOUS

## 2011-11-19 MED ORDER — MAGNESIUM SULFATE BOLUS VIA INFUSION
6.0000 g | Freq: Once | INTRAVENOUS | Status: DC
  Filled 2011-11-19: qty 500

## 2011-11-19 MED ORDER — CITRIC ACID-SODIUM CITRATE 334-500 MG/5ML PO SOLN
ORAL | Status: AC
  Filled 2011-11-19: qty 15

## 2011-11-19 MED ORDER — ONDANSETRON HCL 4 MG/2ML IJ SOLN: 4.0000 mg | Freq: Three times a day (TID) | INTRAMUSCULAR | Status: DC | PRN

## 2011-11-19 MED ORDER — MORPHINE SULFATE (PF) 0.5 MG/ML IJ SOLN
INTRAMUSCULAR | Status: DC | PRN
  Administered 2011-11-19: .1 ug via INTRATHECAL

## 2011-11-19 MED ORDER — FENTANYL CITRATE 0.05 MG/ML IJ SOLN
INTRAMUSCULAR | Status: AC
  Filled 2011-11-19: qty 2

## 2011-11-19 MED ORDER — KETOROLAC TROMETHAMINE 30 MG/ML IJ SOLN
INTRAMUSCULAR | Status: AC
  Administered 2011-11-19: 30 mg via INTRAVENOUS
  Filled 2011-11-19: qty 1

## 2011-11-19 MED ORDER — BUPIVACAINE-EPINEPHRINE PF 0.25-1:200000 % IJ SOLN
INTRAMUSCULAR | Status: AC
  Filled 2011-11-19: qty 30

## 2011-11-19 MED ORDER — KETOROLAC TROMETHAMINE 30 MG/ML IJ SOLN
30.0000 mg | Freq: Four times a day (QID) | INTRAMUSCULAR | Status: DC | PRN
  Administered 2011-11-19 (×2): 30 mg via INTRAVENOUS
  Filled 2011-11-19: qty 1

## 2011-11-19 MED ORDER — BUPIVACAINE IN DEXTROSE 0.75-8.25 % IT SOLN
INTRATHECAL | Status: DC | PRN
  Administered 2011-11-19: 11.75 mg via INTRATHECAL

## 2011-11-19 MED ORDER — 0.9 % SODIUM CHLORIDE (POUR BTL) OPTIME
TOPICAL | Status: DC | PRN
  Administered 2011-11-19: 1000 mL

## 2011-11-19 MED ORDER — DIBUCAINE 1 % RE OINT: 1.0000 "application " | TOPICAL_OINTMENT | RECTAL | Status: DC | PRN

## 2011-11-19 MED ORDER — ZOLPIDEM TARTRATE 5 MG PO TABS
5.0000 mg | ORAL_TABLET | Freq: Every evening | ORAL | Status: DC | PRN
Start: 2011-11-19 — End: 2011-11-22

## 2011-11-19 MED ORDER — ONDANSETRON HCL 4 MG PO TABS: 4.0000 mg | ORAL_TABLET | ORAL | Status: DC | PRN

## 2011-11-19 MED ORDER — DIPHENHYDRAMINE HCL 50 MG/ML IJ SOLN: 12.5000 mg | INTRAMUSCULAR | Status: DC | PRN

## 2011-11-19 MED ORDER — MAGNESIUM SULFATE 40 G IN LACTATED RINGERS - SIMPLE
2.0000 g/h | INTRAVENOUS | Status: DC
  Administered 2011-11-19: 12 g/h via INTRAVENOUS
  Filled 2011-11-19: qty 500

## 2011-11-19 MED ORDER — LACTATED RINGERS IV BOLUS (SEPSIS)
500.0000 mL | Freq: Once | INTRAVENOUS | Status: AC
  Administered 2011-11-19: 500 mL via INTRAVENOUS

## 2011-11-19 MED ORDER — LANOLIN HYDROUS EX OINT: 1.0000 "application " | TOPICAL_OINTMENT | CUTANEOUS | Status: DC | PRN

## 2011-11-19 MED ORDER — SCOPOLAMINE 1 MG/3DAYS TD PT72
1.0000 | MEDICATED_PATCH | Freq: Once | TRANSDERMAL | Status: AC
  Administered 2011-11-19: 1.5 mg via TRANSDERMAL

## 2011-11-19 MED ORDER — DIPHENHYDRAMINE HCL 25 MG PO CAPS: 25.0000 mg | ORAL_CAPSULE | ORAL | Status: DC | PRN

## 2011-11-19 MED ORDER — PHENYLEPHRINE HCL 10 MG/ML IJ SOLN
INTRAMUSCULAR | Status: DC | PRN
  Administered 2011-11-19: 40 ug via INTRAVENOUS
  Administered 2011-11-19: 80 ug via INTRAVENOUS
  Administered 2011-11-19: 40 ug via INTRAVENOUS
  Administered 2011-11-19 (×3): 80 ug via INTRAVENOUS
  Administered 2011-11-19: 40 ug via INTRAVENOUS
  Administered 2011-11-19: 80 ug via INTRAVENOUS
  Administered 2011-11-19 (×2): 40 ug via INTRAVENOUS

## 2011-11-19 MED ORDER — CEFAZOLIN SODIUM 1-5 GM-% IV SOLN
INTRAVENOUS | Status: DC | PRN
  Administered 2011-11-19: 2 g via INTRAVENOUS

## 2011-11-19 MED ORDER — KETOROLAC TROMETHAMINE 60 MG/2ML IM SOLN
60.0000 mg | Freq: Once | INTRAMUSCULAR | Status: AC | PRN
  Filled 2011-11-19: qty 2

## 2011-11-19 MED ORDER — SIMETHICONE 80 MG PO CHEW
80.0000 mg | CHEWABLE_TABLET | Freq: Three times a day (TID) | ORAL | Status: DC
  Administered 2011-11-19 – 2011-11-22 (×9): 80 mg via ORAL

## 2011-11-19 MED ORDER — OXYTOCIN 20 UNITS IN LACTATED RINGERS INFUSION - SIMPLE
INTRAVENOUS | Status: AC
  Administered 2011-11-19: 20 [IU] via INTRAVENOUS
  Filled 2011-11-19: qty 1000

## 2011-11-19 MED ORDER — SODIUM CHLORIDE 0.9 % IV SOLN: 1.0000 ug/kg/h | INTRAVENOUS | Status: DC | PRN

## 2011-11-19 MED ORDER — OXYCODONE-ACETAMINOPHEN 5-325 MG PO TABS
2.0000 | ORAL_TABLET | Freq: Once | ORAL | Status: AC
  Administered 2011-11-19: 2 via ORAL
  Filled 2011-11-19: qty 2

## 2011-11-19 MED ORDER — TETANUS-DIPHTH-ACELL PERTUSSIS 5-2.5-18.5 LF-MCG/0.5 IM SUSP
0.5000 mL | Freq: Once | INTRAMUSCULAR | Status: DC
Start: 2011-11-20 — End: 2011-11-22
  Filled 2011-11-19: qty 0.5

## 2011-11-19 MED ORDER — DIPHENHYDRAMINE HCL 25 MG PO CAPS: 25.0000 mg | ORAL_CAPSULE | Freq: Four times a day (QID) | ORAL | Status: DC | PRN

## 2011-11-19 MED ORDER — BISACODYL 10 MG RE SUPP: 10.0000 mg | Freq: Every day | RECTAL | Status: DC | PRN

## 2011-11-19 MED ORDER — FENTANYL CITRATE 0.05 MG/ML IJ SOLN
INTRAMUSCULAR | Status: AC
  Administered 2011-11-19: 50 ug via INTRAVENOUS
  Filled 2011-11-19: qty 2

## 2011-11-19 MED ORDER — SCOPOLAMINE 1 MG/3DAYS TD PT72
MEDICATED_PATCH | TRANSDERMAL | Status: AC
  Administered 2011-11-19: 1.5 mg via TRANSDERMAL
  Filled 2011-11-19: qty 1

## 2011-11-19 MED ORDER — MEASLES, MUMPS & RUBELLA VAC ~~LOC~~ INJ: 0.5000 mL | INJECTION | Freq: Once | SUBCUTANEOUS | Status: DC

## 2011-11-19 MED ORDER — SIMETHICONE 80 MG PO CHEW: 80.0000 mg | CHEWABLE_TABLET | ORAL | Status: DC | PRN

## 2011-11-19 MED ORDER — FENTANYL CITRATE 0.05 MG/ML IJ SOLN
INTRAMUSCULAR | Status: DC | PRN
  Administered 2011-11-19: 15 ug via INTRATHECAL

## 2011-11-19 MED ORDER — ONDANSETRON HCL 4 MG/2ML IJ SOLN
INTRAMUSCULAR | Status: DC | PRN
  Administered 2011-11-19: 4 mg via INTRAVENOUS

## 2011-11-19 MED ORDER — OXYTOCIN 20 UNITS IN LACTATED RINGERS INFUSION - SIMPLE
125.0000 mL/h | INTRAVENOUS | Status: DC
  Filled 2011-11-19: qty 1000

## 2011-11-19 MED ORDER — MENTHOL 3 MG MT LOZG: 1.0000 | LOZENGE | OROMUCOSAL | Status: DC | PRN

## 2011-11-19 MED ORDER — CEFAZOLIN SODIUM 1-5 GM-% IV SOLN
1.0000 g | Freq: Three times a day (TID) | INTRAVENOUS | Status: AC
  Administered 2011-11-19 – 2011-11-20 (×2): 1 g via INTRAVENOUS
  Filled 2011-11-19 (×2): qty 50

## 2011-11-19 MED ORDER — SODIUM CHLORIDE 0.9 % IJ SOLN: 3.0000 mL | Freq: Two times a day (BID) | INTRAMUSCULAR | Status: DC

## 2011-11-19 MED ORDER — FLEET ENEMA 7-19 GM/118ML RE ENEM: 1.0000 | ENEMA | Freq: Every day | RECTAL | Status: DC | PRN

## 2011-11-19 MED ORDER — SENNOSIDES-DOCUSATE SODIUM 8.6-50 MG PO TABS
2.0000 | ORAL_TABLET | Freq: Every day | ORAL | Status: DC
  Administered 2011-11-19: 2 via ORAL

## 2011-11-19 MED ORDER — OXYCODONE-ACETAMINOPHEN 5-325 MG PO TABS
1.0000 | ORAL_TABLET | ORAL | Status: DC | PRN
  Administered 2011-11-20: 1 via ORAL
  Administered 2011-11-20: 2 via ORAL
  Administered 2011-11-20: 1 via ORAL
  Administered 2011-11-20 – 2011-11-21 (×5): 2 via ORAL
  Administered 2011-11-21 – 2011-11-22 (×2): 1 via ORAL
  Administered 2011-11-22: 2 via ORAL
  Filled 2011-11-19 (×3): qty 2
  Filled 2011-11-19: qty 1
  Filled 2011-11-19: qty 2
  Filled 2011-11-19: qty 1
  Filled 2011-11-19 (×4): qty 2
  Filled 2011-11-19 (×2): qty 1

## 2011-11-19 MED ORDER — MORPHINE SULFATE 0.5 MG/ML IJ SOLN
INTRAMUSCULAR | Status: AC
  Filled 2011-11-19: qty 10

## 2011-11-19 MED ORDER — SODIUM CHLORIDE 0.9 % IV SOLN: 250.0000 mL | INTRAVENOUS | Status: DC | PRN

## 2011-11-19 MED ORDER — METOCLOPRAMIDE HCL 5 MG/ML IJ SOLN: 10.0000 mg | Freq: Three times a day (TID) | INTRAMUSCULAR | Status: DC | PRN

## 2011-11-19 MED ORDER — WITCH HAZEL-GLYCERIN EX PADS: 1.0000 "application " | MEDICATED_PAD | CUTANEOUS | Status: DC | PRN

## 2011-11-19 MED ORDER — METHYLERGONOVINE MALEATE 0.2 MG PO TABS: 0.2000 mg | ORAL_TABLET | ORAL | Status: DC | PRN

## 2011-11-19 MED ORDER — BUPIVACAINE HCL (PF) 0.25 % IJ SOLN
INTRAMUSCULAR | Status: DC | PRN
  Administered 2011-11-19: 5 mL

## 2011-11-19 MED ORDER — MEPERIDINE HCL 25 MG/ML IJ SOLN: 6.2500 mg | INTRAMUSCULAR | Status: DC | PRN

## 2011-11-19 MED ORDER — NALBUPHINE HCL 10 MG/ML IJ SOLN
5.0000 mg | INTRAMUSCULAR | Status: DC | PRN
  Administered 2011-11-19: 5 mg via SUBCUTANEOUS
  Administered 2011-11-19: 10 mg via SUBCUTANEOUS
  Filled 2011-11-19 (×2): qty 1

## 2011-11-19 MED ORDER — OXYTOCIN 20 UNITS IN LACTATED RINGERS INFUSION - SIMPLE
INTRAVENOUS | Status: DC | PRN
  Administered 2011-11-19: 20 [IU] via INTRAVENOUS

## 2011-11-19 MED ORDER — NALBUPHINE HCL 10 MG/ML IJ SOLN
5.0000 mg | INTRAMUSCULAR | Status: DC | PRN
  Filled 2011-11-19: qty 1

## 2011-11-19 MED ORDER — NALOXONE HCL 0.4 MG/ML IJ SOLN: 0.4000 mg | INTRAMUSCULAR | Status: DC | PRN

## 2011-11-19 MED ORDER — ONDANSETRON HCL 4 MG/2ML IJ SOLN
INTRAMUSCULAR | Status: AC
  Filled 2011-11-19: qty 2

## 2011-11-19 MED ORDER — METHYLERGONOVINE MALEATE 0.2 MG/ML IJ SOLN: 0.2000 mg | INTRAMUSCULAR | Status: DC | PRN

## 2011-11-19 MED ORDER — KETOROLAC TROMETHAMINE 30 MG/ML IJ SOLN: 30.0000 mg | Freq: Four times a day (QID) | INTRAMUSCULAR | Status: DC | PRN

## 2011-11-19 MED ORDER — ONDANSETRON HCL 4 MG/2ML IJ SOLN: 4.0000 mg | INTRAMUSCULAR | Status: DC | PRN

## 2011-11-19 MED ORDER — IBUPROFEN 600 MG PO TABS: 600.0000 mg | ORAL_TABLET | Freq: Four times a day (QID) | ORAL | Status: DC | PRN

## 2011-11-19 MED ORDER — OXYTOCIN 10 UNIT/ML IJ SOLN
INTRAMUSCULAR | Status: AC
  Filled 2011-11-19: qty 2

## 2011-11-19 MED ORDER — FENTANYL CITRATE 0.05 MG/ML IJ SOLN
25.0000 ug | INTRAMUSCULAR | Status: DC | PRN
  Administered 2011-11-19 (×3): 50 ug via INTRAVENOUS

## 2011-11-19 SURGICAL SUPPLY — 44 items
APL SKNCLS STERI-STRIP NONHPOA (GAUZE/BANDAGES/DRESSINGS)
BENZOIN TINCTURE PRP APPL 2/3 (GAUZE/BANDAGES/DRESSINGS) IMPLANT
CHLORAPREP W/TINT 26ML (MISCELLANEOUS) ×2 IMPLANT
CLOTH BEACON ORANGE TIMEOUT ST (SAFETY) ×2 IMPLANT
CONTAINER PREFILL 10% NBF 15ML (MISCELLANEOUS) IMPLANT
DRESSING TELFA 8X3 (GAUZE/BANDAGES/DRESSINGS) ×3 IMPLANT
DRSG PAD ABDOMINAL 8X10 ST (GAUZE/BANDAGES/DRESSINGS) ×1 IMPLANT
ELECT REM PT RETURN 9FT ADLT (ELECTROSURGICAL) ×2
ELECTRODE REM PT RTRN 9FT ADLT (ELECTROSURGICAL) ×1 IMPLANT
EXTRACTOR VACUUM KIWI (MISCELLANEOUS) IMPLANT
EXTRACTOR VACUUM M CUP 4 TUBE (SUCTIONS) IMPLANT
GAUZE SPONGE 4X4 12PLY STRL LF (GAUZE/BANDAGES/DRESSINGS) ×3 IMPLANT
GLOVE BIO SURGEON STRL SZ 6.5 (GLOVE) ×2 IMPLANT
GLOVE BIOGEL PI IND STRL 7.0 (GLOVE) ×2 IMPLANT
GLOVE BIOGEL PI INDICATOR 7.0 (GLOVE) ×2
GOWN PREVENTION PLUS LG XLONG (DISPOSABLE) ×6 IMPLANT
KIT ABG SYR 3ML LUER SLIP (SYRINGE) ×1 IMPLANT
NDL HYPO 25X1 1.5 SAFETY (NEEDLE) ×1 IMPLANT
NDL HYPO 25X5/8 SAFETYGLIDE (NEEDLE) IMPLANT
NEEDLE HYPO 25X1 1.5 SAFETY (NEEDLE) ×2 IMPLANT
NEEDLE HYPO 25X5/8 SAFETYGLIDE (NEEDLE) ×2 IMPLANT
NS IRRIG 1000ML POUR BTL (IV SOLUTION) ×2 IMPLANT
PACK C SECTION WH (CUSTOM PROCEDURE TRAY) ×2 IMPLANT
PAD ABD 7.5X8 STRL (GAUZE/BANDAGES/DRESSINGS) ×2 IMPLANT
RTRCTR C-SECT PINK 25CM LRG (MISCELLANEOUS) IMPLANT
SLEEVE SCD COMPRESS KNEE MED (MISCELLANEOUS) IMPLANT
STAPLER VISISTAT 35W (STAPLE) IMPLANT
STRIP CLOSURE SKIN 1/2X4 (GAUZE/BANDAGES/DRESSINGS) IMPLANT
SUT CHROMIC GUT AB #0 18 (SUTURE) IMPLANT
SUT MNCRL 0 VIOLET CTX 36 (SUTURE) ×3 IMPLANT
SUT MON AB 4-0 PS1 27 (SUTURE) IMPLANT
SUT MONOCRYL 0 CTX 36 (SUTURE) ×3
SUT PLAIN 2 0 (SUTURE)
SUT PLAIN ABS 2-0 CT1 27XMFL (SUTURE) IMPLANT
SUT VIC AB 0 CT1 27 (SUTURE) ×4
SUT VIC AB 0 CT1 27XBRD ANBCTR (SUTURE) ×2 IMPLANT
SUT VIC AB 2-0 CT1 27 (SUTURE) ×2
SUT VIC AB 2-0 CT1 TAPERPNT 27 (SUTURE) ×1 IMPLANT
SUT VICRYL 0 TIES 12 18 (SUTURE) IMPLANT
SYR CONTROL 10ML LL (SYRINGE) ×2 IMPLANT
TAPE CLOTH SURG 4X10 WHT LF (GAUZE/BANDAGES/DRESSINGS) ×1 IMPLANT
TOWEL OR 17X24 6PK STRL BLUE (TOWEL DISPOSABLE) ×4 IMPLANT
TRAY FOLEY CATH 14FR (SET/KITS/TRAYS/PACK) IMPLANT
WATER STERILE IRR 1000ML POUR (IV SOLUTION) ×1 IMPLANT

## 2011-11-19 NOTE — Progress Notes (Addendum)
Patient ID: Kristin Parks, female   DOB: 1986/03/02, 26 y.o.   MRN: [id number]   TC to Dr Cherly Hensen in OR to update on status.  Magnesium bolus infused on 2 gm/hr at this time Ctx remain every 4-5 minutes and uncomfortable to patient More thick bloody discharge +  WBC 14 (12.1 last check) / afebrile  NO solid food since 1600 yesterday - sips of clear fluids this am  NPO at this time   - will come evaluate patient when out of OR  [name] CNM

## 2011-11-19 NOTE — Anesthesia Preprocedure Evaluation (Signed)
Anesthesia Evaluation  Patient identified by MRN, date of birth, ID band Patient awake    Reviewed: Allergy & Precautions, H&P , Patient's Chart, lab work & pertinent test results  Airway Mallampati: II TM Distance: >3 FB Neck ROM: full    Dental No notable dental hx.    Pulmonary  clear to auscultation  Pulmonary exam normal       Cardiovascular Exercise Tolerance: Good regular Normal    Neuro/Psych    GI/Hepatic   Endo/Other    Renal/GU      Musculoskeletal   Abdominal   Peds  Hematology   Anesthesia Other Findings   Reproductive/Obstetrics                           Anesthesia Physical Anesthesia Plan  ASA: II  Anesthesia Plan: Spinal   Post-op Pain Management:    Induction:   Airway Management Planned:   Additional Equipment:   Intra-op Plan:   Post-operative Plan:   Informed Consent: I have reviewed the patients History and Physical, chart, labs and discussed the procedure including the risks, benefits and alternatives for the proposed anesthesia with the patient or authorized representative who has indicated his/her understanding and acceptance.   Dental Advisory Given  Plan Discussed with: CRNA  Anesthesia Plan Comments: (Lab work confirmed..... Platelets okay. WBC elevated, no ABX given, Patient not acting septic, VSS. Discussed spinal anesthetic, and patient consents to the procedure:  included risk of possible headache,backache, failed block, allergic reaction, and nerve injury. This patient was asked if she had any questions or concerns before the procedure started. )        Anesthesia Quick Evaluation

## 2011-11-19 NOTE — Anesthesia Postprocedure Evaluation (Signed)
Anesthesia Post Note  Patient: Kristin Parks  Procedure(s) Performed:  CESAREAN SECTION - Primary  Anesthesia type: Spinal  Patient location: PACU  Post pain: Pain level controlled  Post assessment: Post-op Vital signs reviewed  Last Vitals:  Filed Vitals:   11/19/11 1300  BP:   Pulse:   Temp:   Resp: 20    Post vital signs: Reviewed  Level of consciousness: awake  Complications: No apparent anesthesia complications

## 2011-11-19 NOTE — Anesthesia Procedure Notes (Signed)
Spinal  Patient location during procedure: OR Start time: 11/19/2011 11:25 AM Staffing Performed by: anesthesiologist  Preanesthetic Checklist Completed: patient identified, site marked, surgical consent, pre-op evaluation, timeout performed, IV checked, risks and benefits discussed and monitors and equipment checked Spinal Block Patient position: sitting Prep: site prepped and draped and DuraPrep Patient monitoring: heart rate, cardiac monitor, continuous pulse ox and blood pressure Approach: midline Location: L3-4 Injection technique: single-shot Needle Needle type: Sprotte  Needle gauge: 24 G Needle length: 9 cm Assessment Sensory level: T4 Additional Notes Clear free flow CSF on first attempt.  Patient tolerated procedure well.  Jasmine December, MD

## 2011-11-19 NOTE — Progress Notes (Addendum)
Patient ID: Kristin Parks, female   DOB: 11-15-1985, 26 y.o.   MRN: [id number]   S: Feeling ctx since ~midnight      Unable to sleep with ctx / uncomfortable - some painful     + FM     Red bleeding around 0600 - thick mucus appearance / no clots  O:  VS: Blood pressure 95/46, pulse 66, temperature 97.8 F (36.6 C), temperature source Oral, resp. rate 18, height 5\' 5"  (1.651 m), weight 65.273 kg (143 lb 14.4 oz), last menstrual period 05/23/2011, SpO2 97.00%.         Alert and uncomfortable with ctx / no acute pain or distress        Abdomen - soft and non-tender / uterus non-tender with palpation        FHR : baseline 125 / variability moderate / accels +                   decels few mild variables with nadir 100 x 20 sec        Toco: contractions every 2-5 minutes        Cervix appears closed on sterile speculum exam - nothing visible thru cervical os                    Moderate amount dark bloody thick discharge in vaginal vault / malodorous           Last sono - breech / oligiohydramnios      A: PPROM / hx marginal abruption w/ adherent clot     Possible onset of labor related to developing chorioamnionitis     FHR category 1  P: CBCD      Restart Magnesium for neuro-chemo prophylaxis      Call Dr Cherly Hensen for evaluation of patient      continuous EFM    BAILEY,TANYA 11/19/2011, 8:25 AM

## 2011-11-19 NOTE — Progress Notes (Signed)
On call MD  Pt seen by [name] this am. Chart reviewed . 25 5/7 wk breech w/ PPROM currently on magnesium for ctx and neuroprophylaxis.  Temp:982 129/92 92 ABD: gravid GBS cx neg. Bed side sono: breech VE: 1cm /80/breech foul smelling bloody fluid. Wbc 14K  Tracing: baseline 130 (+) ctx q 4 mins  IMP> PPROM w/ chorioamnionitis(clinicallY) despite afebrile and wbc unchanged with prob subclinical abruption  P)Proceed with C/S. Risk of C/S reviewed w/ pt and husband: infection, bleeding, injury to surrounding organ, poss need for C/S in the future.  OR notified. Pt and husband agree both of whom smelled the fluid odor when the speculum came out

## 2011-11-19 NOTE — Progress Notes (Signed)
UR Chart review completed.  

## 2011-11-19 NOTE — Consult Note (Signed)
Neonatology Note:   Attendance at C-section:    I was asked to attend this primary C/S at 25 5/[redacted] weeks GA due to breech presentation, PPROM for 13 days, and foul-smelling amniotic fluid. The mother is a G1P0 A pos, GBS unknown with a history of depression and panic disorder. She was admitted on 1/12 and received antibiotics, betamethasone, and has also gotten magnesium sulfate neuroprophylaxis. She has been afebrile, but the amniotic fluid has been foul today. There has also been a chronic placental abruption with passage of clots. At delivery, the baby was frank breech. She was dusky with some tone and minimal resp effort. We did bulb suctioning for bloody fluid, dried her quickly, and placed her into the portawarmer bag for temp support. PPV was applied and the HR was > 100 with improving color and good chest excursion. I intubated her atraumatically with a 2.5 mm ETT to a depth of 7 cm at the lip at 2 minutes of life. The CO2 detector turned yellow quickly and equal breath sounds could be heard. A pulse oximeter was placed and the FIO2, which had been at 60%, was adjusted downward. We gave Infasurf 2.5 ml via the ETT at about 6 minutes of life, tolerated well. She was seen briefly by her parents in the OR, then was transported to the NICU being hand-bagged with 30% FIO2 via ETT. She was moving all 4 extremities and had good color and perfusion. Ap 5/9. Her father was in attendance to the NICU for further care.   Deatra James, MD

## 2011-11-19 NOTE — Brief Op Note (Signed)
11/06/2011 - 11/19/2011  12:11 PM  PATIENT:  Kristin Parks  26 y.o. female  PRE-OPERATIVE DIAGNOSIS:  Chorioamnionitis, Breech,  Prolonged PPROM, IUP @ 25 5/7 wk, ? Placental abruption  POST-OPERATIVE DIAGNOSIS:  Chorioamnionitis, [name]  Breech, Prolonged PPROM, IUP @ 25 5/7 wk, ? Septated uterus  PROCEDURE:  Procedure(s): Primary low vertical CESAREAN SECTION  SURGEON:  Surgeon(s): Serita Kyle, MD  PHYSICIAN ASSISTANT:   ASSISTANTS: [name], CNM   ANESTHESIA:   spinal  FINDINGS: live female frank breech position w/ nuchal cord. Apgar 5/9 1lb 15 oz, nl tubes and ovaries. ? septated uterus w/ band removed while removing placenta which was in left cornual area  EBL:  Total I/O In: 339.6 [I.V.:339.6] Out: 1050 [Urine:650; Blood:400]  BLOOD ADMINISTERED:none  DRAINS: none   LOCAL MEDICATIONS USED:  MARCAINE 5CC  SPECIMEN:  Source of Specimen:  placenta   DISPOSITION OF SPECIMEN:  PATHOLOGY  COUNTS:  YES  TOURNIQUET:  * No tourniquets in log *  DICTATION: .Other Dictation: Dictation Number [id number]  PLAN OF CARE: Admit to inpatient   PATIENT DISPOSITION:  PACU - hemodynamically stable.   Delay start of Pharmacological VTE agent (>24hrs) due to surgical blood loss or risk of bleeding:  {YES/NO/NOT APPLICABLE:20182

## 2011-11-19 NOTE — Transfer of Care (Signed)
Immediate Anesthesia Transfer of Care Note  Patient: Kristin Parks  Procedure(s) Performed:  CESAREAN SECTION - Primary  Patient Location: PACU  Anesthesia Type: Spinal  Level of Consciousness: awake, alert  and oriented  Airway & Oxygen Therapy: Patient Spontanous Breathing  Post-op Assessment: Report given to PACU RN and Post -op Vital signs reviewed and stable  Post vital signs: Reviewed and stable  Complications: No apparent anesthesia complications

## 2011-11-20 ENCOUNTER — Encounter (HOSPITAL_COMMUNITY): Payer: Self-pay

## 2011-11-20 LAB — CBC
Hemoglobin: 8.9 g/dL — ABNORMAL LOW (ref 12.0–15.0)
MCH: 30.1 pg (ref 26.0–34.0)
MCV: 91.6 fL (ref 78.0–100.0)
RBC: 2.96 MIL/uL — ABNORMAL LOW (ref 3.87–5.11)

## 2011-11-20 MED ORDER — DOCUSATE SODIUM 100 MG PO CAPS
100.0000 mg | ORAL_CAPSULE | Freq: Three times a day (TID) | ORAL | Status: DC
  Administered 2011-11-20 – 2011-11-22 (×7): 100 mg via ORAL
  Filled 2011-11-20 (×7): qty 1

## 2011-11-20 NOTE — Progress Notes (Signed)
PSYCHOSOCIAL ASSESSMENT ~ MATERNAL/CHILD  Name: Kristin Parks  Age 27 day  Referral Date 11/20/11  Reason/Source NICU  I. FAMILY/HOME ENVIRONMENT  A. Child's Legal Guardian Parent(s) X Grandparent Foster parent DSS  Name Kristin Parks  DOB 09/15/1986  Age 26 y/o  Address [address], [place], [place] [address]  Name Kristin Parks FOB  DOB  Age  Address Same as above  Other Household Members/Support Persons  C. Other Support  II. PSYCHOSOCIAL DATA A. Information Source Patient Interview X Family Interview Other  B. Financial and Community Resources Employment [place]  Medicaid F[place] out [place]ICU [place]SI [place]  Private Insurance BCBS Self Pay  Food Stamps WIC Provided information per pt request [employer] [place] [employer]  Maternity Care Coordination/Child Service Coordination/Early Intervention  School  Grade  Other  Cultural and Environment Information  Cultural Issues Impacting Care  III. STRENGTHS Supportive family/friends Yes  Adequate Resources Yes  Compliance with medical plan Yes  Home prepared for Child (including basic supplies) Yes  Understanding of illness Yes  Other  IV. RISK FACTORS AND CURRENT PROBLEMS  V. No Problems Note Substance Abuse Pt Family Mental Illness Pt Family  Family/Relationship Issues Pt Family  Abuse/Neglect/Domestic Violence Pt Family  Financial Resources Pt Family  Transportation Pt Family  DSS Involvement Pt Family  Adjustment to Illness Pt Family  Knowledge/Cognitive Deficit Pt Family  Compliance with Treatment Pt Family  Basic Needs (food, housing, etc) Pt Family  Housing Concerns Pt Family  Other  VI. SOCIAL WORK ASSESSMENT SW received referral due to NICU admission. Pt denied any depression and anxiety at this time. She expressed understanding of babies condition. Pt stated she will be on FMLA from work. FOB works fulltime at [employer]. 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 Per pt  request, WIC info was provided. SW provided education regarding baby qualifying for SSI due to NICU admission. Pt stated she has all of the supplies she needs. FOB's mother was also present. Pt's parents live out of town. SW provided paperwork and education regarding [facility]. No other needs expressed at this time.  VII. SOCIAL WORK PLAN (In Bold) No Further Intervention Required/No Barriers to Discharge  Psychosocial Support and Ongoing Assessment of Needs  Patient/Family  Education  Child Protective Services Report  County Date  Information/Referral to Community Resources  Other    County        Date Information/Referral to [employer]

## 2011-11-20 NOTE — Progress Notes (Signed)
Patient ID: Kristin Parks, female   DOB: June 23, 1986, 26 y.o.   MRN: [id number]  POSTOPERATIVE DAY # 1 S/P cesarean section - 25 weeks PPROM / breech   S:         Reports feeling well - minimal pain             Tolerating po intake / no nausea / no vomiting / no flatus /  BM yesterday in PACU             Bleeding is light             Pain controlled with motrin and percocet             Up ad lib / ambulatory  Newborn stable in NICU - pumping started for breast feeding  / female newborn   O:  A & O x 3 NAD             VS: Blood pressure 94/54, pulse 69, temperature 98.8 F (37.1 C), temperature source Oral, resp. rate 16, height 5\' 5"  (1.651 m), weight 65.273 kg (143 lb 14.4 oz), last menstrual period 05/23/2011, SpO2 97.00%, unknown if currently breastfeeding.  LABS: WBC/Hgb/Hct/Plts:  11.7/8.9/27.1/286 (01/26 7846)   I&O:  Total I/O In: 698.3 [P.O.:440; I.V.:258.3] Out: -   Lungs: Clear and unlabored  Heart: regular rate and rhythm / no mumurs  Abdomen: soft, non-tender, non-distended , hypoactive bowel sounds             Fundus: firm, non-tender, U-1             Dressing intact without drainage              Perineum: no edema  Lochia: light  Extremities: no edema, no calf pain or tenderness  A:        POD # 1 S/P cesarean section            Chronic constipation            Chronic iron deficiency anemia  P:        Routine postoperative care              Maintain constipation therapy             Iron at discharge - every other day with Niferex             Lactation consult     Kristin Parks 11/20/2011, 10:45 AM

## 2011-11-20 NOTE — Plan of Care (Signed)
Problem: Discharge Progression Outcomes Goal: MMR given as ordered Outcome: Not Applicable Date Met:  11/20/11 Prenatal record from [facility] office showed result to be reactive

## 2011-11-20 NOTE — Op Note (Signed)
Kristin Parks, Kristin Parks                 ACCOUNT NO.:  [id number]  MEDICAL RECORD NO.:  [id number]  LOCATION:  9304                          FACILITY:  [facility]  PHYSICIAN:  Maxie Better, M.D.DATE OF BIRTH:  [birth year]  DATE OF PROCEDURE:  11/19/2011 DATE OF DISCHARGE:                              OPERATIVE REPORT   PREOPERATIVE DIAGNOSES:  Chorioamnionitis, question placental abruption, breech presentation, prolonged premature rupture of membranes, intrauterine gestation at 79 and 5/7th weeks.  POSTOPERATIVE DIAGNOSES:  [name] breech presentation, chorioamnionitis, question septated uterus, question placental abruption, prolonged premature rupture of membranes, intrauterine gestation at 2 and 5/7th weeks.  PROCEDURES:  A primary low vertical cesarean section.  ANESTHESIA:  Spinal.  SURGEON:  Maxie Better, MD  ASSISTANT:  [name], CNM.  PROCEDURE:  Under adequate spinal anesthesia, the patient was placed in the supine position with a left lateral tilt.  An indwelling Foley catheter had been placed.  The patient was then sterilely prepped and draped in usual fashion.  Marcaine 0.25% was injected along the planned Pfannenstiel skin incision site.  Pfannenstiel skin incision was then made, carried down to the rectus fascia.  Rectus fascia was opened transversely.  Rectus fascia was then bluntly and sharply dissected off the rectus muscle in superior and inferior fashion.  The rectus muscle was split in midline.  The parietal peritoneum was entered sharply and extended.  The lower uterine segment though appeared to be slightly developed, was also too narrow for transverse incision.   The vesicouterine peritoneum was opened transversely. Decision was then made to do the low vertical incision, which was done in the lower uterine segment and extended carefully. Subsequent delivery of a live female using a breech maneuver from a frank breech position was accomplished.  The  baby urinated and had meconium bowel movement as she was being delivered.  Cord around the neck x1 was noted and reduced.  Baby was delivered.  Cord was clamped, cut, and the baby was transferred to the awaiting pediatrician.  The subsequently assigned Apgars of 5 and 9 at 1 and 5 minutes.  Cord pH was obtained.  Cord bloods were obtained.  The placenta was in the left fundal area and was manually removed.  It was very ratty.  There is a band of tissue, which subsequently was cut. Once the majority of the placenta was removed, the cavity on the left side was explored, and with a ring clamp and additional tissue was removed.  The band was thought to be possible septation of the uterus, which had been cut because the placenta was partially adherent to that area.  The cavity was explored for additional tissue when was felt to be empty.  The incision was closed in 2 layers.  The 1st layer was 0 Monocryl running locked stitch, 2nd layer was imbricated using 0 Monocryl suture.  Small bleeding on the left side of the vesicouterine peritoneum was cauterized.  Good hemostasis was noted.  The vesicouterine peritoneum was not reapproximated.  Normal tubes and ovaries were noted bilaterally.  There was no indentation of the fundus noted.  The abdomen was copiously irrigated and suctioned of debris.  The placenta was sent  to pathology. The parietal peritoneum was closed with 2-0 Vicryl.  The rectus fascia closed with 0 Vicryl x2.  The subcutaneous area was irrigated.  Small bleeders cauterized.  Interrupted 2-0 plain sutures placed and the skin approximated with Ethicon staples.  SPECIMENS:  Placenta sent to pathology.  ESTIMATED BLOOD LOSS:  400 mL.  INTRAOPERATIVE FLUID:  1700 mL.  URINE OUTPUT:  650 mL clear yellow urine.  Sponge and instrument counts x2 was correct.  COMPLICATION:  None.  The patient tolerated the procedure well.  The baby was transferred to the neonatal intensive care  unit due to prematurity.  The patient was transferred to recovery in stable condition.     Maxie Better, M.D.     [place]/MED[facility]  D:  11/19/2011  T:  11/20/2011  Job:  [id number]

## 2011-11-21 NOTE — Progress Notes (Signed)
Patient ID: Kristin Parks, female   DOB: [birth year], 26 y.o.   MRN: [id number]  POSTOPERATIVE DAY # 2 S/P cesarean section @ 25 weeks   S:         Reports feeling well - minimal pain             Tolerating po intake / no nausea / no vomiting / + flatus / no BM             Bleeding is spotting             Pain controlled withmotrin and percocet             Up ad lib / ambulatory  Newborn - pumping for newborn / newborn stable in NICU - extubated this am  O:  A & O x 3 NAD             VS: Blood pressure 90/54, pulse 61, temperature 97.3 F (36.3 C), temperature source Oral, resp. rate 18, height 5\' 5"  (1.651 m), weight 65.273 kg (143 lb 14.4 oz), last menstrual period 05/23/2011, SpO2 98.00%, unknown if currently breastfeeding.  Lungs: Clear and unlabored  Heart: regular rate and rhythm / no mumurs  Abdomen: soft, non-tender, non-distended, active bowel sounds             Fundus: firm, non-tender, U-3             Dressing OFF              Incision:  approximated with staples / no erythema / no ecchymosis / no drainage  Perineum: no edema  Lochia: spotting  Extremities: no edema, no calf pain or tenderness  A:        POD # 2 S/P cesarean section at 25 weeks / newborn in NICU            Chronic anemia : iron deficiency  - hemodynamically stable            HX chronic constipation  P:        Routine postoperative care              Anticipate discharge in am     [facility] 11/21/2011, 10:36 AM

## 2011-11-22 ENCOUNTER — Encounter (HOSPITAL_COMMUNITY)
Admission: RE | Admit: 2011-11-22 | Discharge: 2011-11-22 | Disposition: A | Discharge status: Home / Self Care | Payer: BC Managed Care – PPO | Source: Ambulatory Visit | Attending: Certified Nurse Midwife | Admitting: Certified Nurse Midwife

## 2011-11-22 ENCOUNTER — Encounter (HOSPITAL_COMMUNITY): Payer: Self-pay | Admitting: *Deleted

## 2011-11-22 DIAGNOSIS — O923 Agalactia: Secondary | ICD-10-CM | POA: Insufficient documentation

## 2011-11-22 MED ORDER — MAGNESIUM OXIDE 400 MG PO TABS: 400.0000 mg | ORAL_TABLET | Freq: Every day | ORAL | Status: DC

## 2011-11-22 MED ORDER — IBUPROFEN 600 MG PO TABS: 600.0000 mg | ORAL_TABLET | Freq: Four times a day (QID) | ORAL | Status: AC

## 2011-11-22 MED ORDER — OXYCODONE-ACETAMINOPHEN 5-325 MG PO TABS: 1.0000 | ORAL_TABLET | ORAL | Status: AC | PRN

## 2011-11-22 MED ORDER — DOCUSATE SODIUM 100 MG PO CAPS: 100.0000 mg | ORAL_CAPSULE | Freq: Three times a day (TID) | ORAL | Status: DC | PRN

## 2011-11-22 NOTE — Progress Notes (Signed)
POSTOPERATIVE DAY # 3 S/P cesarean section ( 25 weeks)   S:         Reports feeling well - engorged this am / milk in during the night             Tolerating po intake / no nausea / no vomiting / + flatus / + BM             Bleeding is spotting             Pain controlled with motrin and percocet             Up ad lib / ambulatory  Newborn breast feeding planned - pumping   O:  A & O x 3 NAD             VS: Blood pressure 103/66, pulse 73, temperature 97.5 F (36.4 C), temperature source Oral, resp. rate 17, height 5\' 5"  (1.651 m), weight 143 lb 14.4 oz (65.273 kg), last menstrual period 05/23/2011, SpO2 96.00%, unknown if currently breastfeeding.  Lungs: Clear and unlabored  Heart: regular rate and rhythm   Abdomen: soft, non-tender, non-distended, active BS             Fundus: firm, non-tender, U-2             Dressing OFF              Incision:  approximated with staples / no erythema / no ecchymosis / no drainage / edges not completely adherent - some mild edema superior to incision  Perineum: no edema  Lochia: spotting  Extremities: no edema, no calf pain or tenderness  A:        POD # 3 S/P cesarean section at 25 weeks / PPROM            Iron deficiency anemia            Chronic constipation  P:        Routine postoperative care              Discharge home today             Staple removal Wednesday  at [facility]     [facility] 11/22/2011, 9:41 AM

## 2011-11-22 NOTE — Progress Notes (Signed)
UR chart review completed.  

## 2011-11-22 NOTE — Discharge Summary (Signed)
POSTOPERATIVE DISCHARGE SUMMARY:  Patient ID: Kristin Parks MRN: [id number] DOB/AGE: Mar 02, 1986 26 y.o.  Admit date: 11/06/2011 Discharge date:  11/22/2011  Admission Diagnoses:  23 6/7 weeks with PPROM Marginal placenta bleed with adherent clot Chronic constipation  Discharge Diagnoses:   POD 3 cesarean section - low vertical at 25 weeks  Partial uterine septum - removed  Chronic constipation  Iron deficiency anemia  Prenatal history: G2P0101   EDC : 02/27/2012, Alternate EDD Entry  Prenatal care at [facility] since 10 weeks Primary provider : Marlinda Parks Parks Prenatal course complicated by depression, chronic constipation, second trimester bleeding, hx previa.  Prenatal Labs: ABO, Rh: A (09/13 1027)  Antibody: Negative (01/16 0000) Rubella:   Immune RPR: Nonreactive (01/16 0000)  HBsAg:   Negative HIV: Non-reactive (01/16 0000)  GBS:   Negative ( screened with admission) 1 hr Glucola : not done   Medical / Surgical History :  Past medical history:  Past Medical History  Diagnosis Date  . ANEMIA-IRON DEFICIENCY 10/23/2009  . DEPRESSION 07/08/2010  . PANIC DISORDER 10/23/2009  . High risk HPV infection     Past surgical history:  Past Surgical History  Procedure Date  . Mouth surgery   . Wisdom tooth extraction     Family History:  Family History  Problem Relation Age of Onset  . Arthritis Mother   . Hypertension Maternal Grandmother   . Arthritis Maternal Grandmother   . Hypertension Maternal Grandfather   . Arthritis Maternal Grandfather   . Hypertension Paternal Grandmother   . Hypertension Paternal Grandfather   . Cancer Neg Hx     grandfather - colon , grandmother - breast , grandfather - leukemia    Social History:  reports that she has quit smoking. She has never used smokeless tobacco. She reports that she does not drink alcohol or use illicit drugs.   Allergies: Review of patient's allergies indicates no known allergies.     Current Medications at time of admission:  Prior to Admission medications   Medication Sig Start Date End Date Taking? Authorizing Provider  hydrocortisone (ANUSOL-HC) 2.5 % rectal cream Place 1 application rectally daily.   Yes Historical Provider, Parks  Prenatal Vit w/Fe-Methylfol-FA (PNV PO) Take by mouth.     Yes Historical Provider, Parks  sertraline (ZOLOFT) 100 MG tablet TAKE 1 TABLET BY MOUTH EVERY DAY 06/29/11  Yes Kristin Parks  docusate sodium (COLACE) 100 MG capsule Take 1 capsule (100 mg total) by mouth 3 (three) times daily as needed for constipation. 11/22/11   Kristin Parks  ibuprofen (ADVIL,MOTRIN) 600 MG tablet Take 1 tablet (600 mg total) by mouth every 6 (six) hours. 11/22/11 12/02/11  Kristin Parks  magnesium oxide (MAG-OX) 400 MG tablet Take 1 tablet (400 mg total) by mouth daily. 11/22/11   Kristin Parks  oxyCODONE-acetaminophen (PERCOCET) 5-325 MG per tablet Take 1 tablet by mouth every 4 (four) hours as needed (moderate - severe pain). 11/22/11 12/02/11  Kristin Parks      Antepartum Hospital Course: Admitted for PROM at 23 weeks. History of acute bleeding episode at 20 weeks diagnosed at that time inaccurately with previa (placenta at 18 weeks and on admission as posterior-lateral / residual clot adherent to margin of placenta. Etiology of bleeding marginal placental abruption with adherent clot. BMZ course completed on admission. Given a total of 3 courses of magnesium sulfate for neuro chemo-prophylaxis during hospitalization ( intially on admission / once with bleeding episode /  within 4 hours of delivery). MFM and Neonatal consultations were completed. Development of oligio in second week of hospitalization with decreased BPP due to low AFI and absent breathing. FHR tracing remained cat 1 with occasional variable decels due to oligio. On HD #13 - onset of regular and persistent ctx and vaginal bleeding. Slight increase in WBC to 14.1 with no maternal fever,  no fetal tachycardia, no uterine tenderness on exam. On spec exam reveals malodorous bloody discharge with concern for chorioamnionitis and onset of labor with breech presentation. Cervical examination by Kristin Parks confirmed onset of preterm labor with cervical dilation and LUSD. Magnesium sulfate 3rd course was initiated prior to delivery by cesarean section.  Procedures: Cesarean section delivery of female  newborn by Kristin Parks  See operative report for further details  Postoperative / postpartum course: uneventful with stable newborn in NICU on day of maternal discharge.  Physical Exam:   VSS: Blood pressure 103/66, pulse 73, temperature 97.5 F (36.4 C), temperature source Oral, resp. rate 17, height 5\' 5"  (1.651 m), weight 143 lb 14.4 oz (65.273 kg), last menstrual period 05/23/2011, SpO2 96.00%, unknown if currently breastfeeding.  LABS:   preop CBC 12.1 / 9.5 / 28.2 / 298              Postop CBC 11.7 / 8.9 / 27.1 / 286    Incision:  approximated with staples / no erythema / no ecchymosis / no drainage Staples: intact for removal POD 5 at [facility]  Discharge Instructions:  Discharged Condition: good Activity: postoperative restrictions x 2 weeks Diet: routine Medications: see below Medication List  As of 11/22/2011  9:49 AM   STOP taking these medications         buPROPion 150 MG 24 hr tablet         TAKE these medications         docusate sodium 100 MG capsule   Commonly known as: COLACE   Take 1 capsule (100 mg total) by mouth 3 (three) times daily as needed for constipation.      hydrocortisone 2.5 % rectal cream   Commonly known as: ANUSOL-HC   Place 1 application rectally daily.      ibuprofen 600 MG tablet   Commonly known as: ADVIL,MOTRIN   Take 1 tablet (600 mg total) by mouth every 6 (six) hours.      magnesium oxide 400 MG tablet   Commonly known as: MAG-OX   Take 1 tablet (400 mg total) by mouth daily.      oxyCODONE-acetaminophen 5-325 MG per tablet    Commonly known as: PERCOCET   Take 1 tablet by mouth every 4 (four) hours as needed (moderate - severe pain).      PNV PO   Take by mouth.      sertraline 100 MG tablet   Commonly known as: ZOLOFT   TAKE 1 TABLET BY MOUTH EVERY DAY         ASK your doctor about these medications         NUVARING 0.12-0.015 MG/24HR vaginal ring   Generic drug: etonogestrel-ethinyl estradiol   Place 1 each vaginally every 28 (twenty-eight) days. Insert vaginally and leave in place for 3 consecutive weeks, then remove for 1 week.           Condition: stable Postpartum Instructions: refer to practice specific booklet Discharge to: home Disposition: Final discharge disposition not confirmed Follow up :  Follow-up Information    Follow up with [facility],  [place]. Schedule an appointment as soon as possible for a visit in 2 weeks. (staple removal 1/30 this week - call for apt time)    Contact information:   [address] [place] [address] [phone]           Signed: Marlinda Parks 11/22/2011, 9:49 AM

## 2011-11-22 NOTE — Progress Notes (Signed)
Visited with pt and grandfather of baby based on nurse's referral that the baby was in NICU.  Pt was in good spirits although she was sad that she would have to go home without the baby when she is discharged later today.  Pt has a good support system and asked me to continue to pray for her baby, Roe Coombs.  We will offer her continued support when we see her in the NICU visiting her baby.  Thornell Sartorius Chaplain Pager, [phone] 10:48 AM  11/22/11 1000  Clinical Encounter Type  Visited With Patient and family together  Visit Type Follow-up  Referral From Nurse  Spiritual Encounters  Spiritual Needs Emotional

## 2011-12-20 ENCOUNTER — Other Ambulatory Visit: Payer: Self-pay | Admitting: Family Medicine

## 2011-12-23 ENCOUNTER — Encounter (HOSPITAL_COMMUNITY)
Admission: RE | Admit: 2011-12-23 | Discharge: 2011-12-23 | Disposition: A | Discharge status: Home / Self Care | Payer: BC Managed Care – PPO | Source: Ambulatory Visit | Attending: Certified Nurse Midwife | Admitting: Certified Nurse Midwife

## 2011-12-23 DIAGNOSIS — O923 Agalactia: Secondary | ICD-10-CM | POA: Insufficient documentation

## 2011-12-27 ENCOUNTER — Ambulatory Visit (HOSPITAL_COMMUNITY)
Admission: RE | Admit: 2011-12-27 | Discharge: 2011-12-27 | Disposition: A | Discharge status: Home / Self Care | Payer: BC Managed Care – PPO | Source: Ambulatory Visit | Attending: Obstetrics & Gynecology | Admitting: Obstetrics & Gynecology

## 2011-12-28 NOTE — Progress Notes (Addendum)
Adult Lactation Consultation Outpatient Visit Note  Patient Name: Kristin Parks Date of Birth: [birth year] Gestational Age at Delivery: 25.7 weeks Type of Delivery:   Breastfeeding History:                 Frequency of Breastfeeding: Baby in NICU and receiving expressed breast milk.   Mom is pumping q3h during day and q4h at night since baby's birth Length of Feeding: N/A Voids:  Stools:   Supplementing / Method: Pumping:  Type of Pump: Medela pump-in-style   Frequency: q3-4h  Volume:  6  oz  Comments:  Mom was seen as a walk-in out-patient after a visit to NICU at 5 pm and referral from NICU nurse to [facility] concerning mom's engorged breasts and left breast pain, possible mastitis. Onset was sudden today but no fever, redness or body aches, per mom.   Consultation Evaluation:    Mom reports severe pain of entire upper area of left breast, starting today, despite regular pumping.  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  Both breasts are full to firm (becoming engorged due to delayed pumping). LC noted red imprint across both breasts due to tight-fitting bra. Mom able to pump about 6 oz, equal amounts from both sides while LC applied gentle pressure above affected site (left breast, upper quadrants, medial and lateral areas) and Mom reports some discomfort during procedure but less pain with softening of affected area. Breasts were softened but tender area still noted in affected areas.  No streaking noted on either breast after pumping session. (see below for further instructions) While Mom pumping, LC recommended larger flange size to (27 mm) with ability to capture more breast tissue for relief. Mom to use either 24 or 27 size flanges based on comfort and effectiveness of pumping.  Initial Feeding Assessment: N/A (baby not seen) Pre-feed Weight: Post-feed Weight: Amount  Transferred: Comments:  Additional Feeding Assessment: Pre-feed Weight: Post-feed Weight: Amount Transferred: Comments:  Additional Feeding Assessment: Pre-feed Weight: Post-feed Weight: Amount Transferred: Comments:  Total Breast milk Transferred this Visit:  Total Supplement Given:   Additional Interventions:      Mom fitted for bra (size 38 D) which provided greater support and more comfortable fit to reduce chance of pressure on breasts.  Plan:  Mom to pump every 3 hours during night and every 2 hours when awake for next few days and report to [facility] and MD if signs/symptoms of mastitis (reviewed) or no relief of plugged/tender areas of left breast. Comfort measures for engorgement, plugged ducts and mastitis, as well as preventive measures reviewed.  Follow-Up    NICU LC, Esaw Dace, IBCLC notified and will followup by phone tomorrow, 12/28/11. Mom encouraged to contact [facility] at any time, as needed.    Note: Late documentation due to patient being seen as  after-hours walk-in and unable to register until today.  Warrick Parisian [facility] 12/28/2011, 4:09 PM

## 2012-01-22 ENCOUNTER — Encounter (HOSPITAL_COMMUNITY)
Admission: RE | Admit: 2012-01-22 | Discharge: 2012-01-22 | Disposition: A | Discharge status: Home / Self Care | Payer: BC Managed Care – PPO | Source: Ambulatory Visit | Attending: Certified Nurse Midwife | Admitting: Certified Nurse Midwife

## 2012-01-22 DIAGNOSIS — O923 Agalactia: Secondary | ICD-10-CM | POA: Insufficient documentation

## 2012-02-16 ENCOUNTER — Encounter: Payer: Self-pay | Admitting: Family Medicine

## 2012-02-16 ENCOUNTER — Ambulatory Visit (INDEPENDENT_AMBULATORY_CARE_PROVIDER_SITE_OTHER): Payer: BC Managed Care – PPO | Admitting: Family Medicine

## 2012-02-16 VITALS — BP 86/66 | Temp 98.9°F | Wt 141.0 lb

## 2012-02-16 DIAGNOSIS — K5289 Other specified noninfective gastroenteritis and colitis: Secondary | ICD-10-CM

## 2012-02-16 DIAGNOSIS — Z23 Encounter for immunization: Secondary | ICD-10-CM

## 2012-02-16 DIAGNOSIS — K529 Noninfective gastroenteritis and colitis, unspecified: Secondary | ICD-10-CM

## 2012-02-16 DIAGNOSIS — F410 Panic disorder [episodic paroxysmal anxiety] without agoraphobia: Secondary | ICD-10-CM

## 2012-02-16 MED ORDER — SERTRALINE HCL 100 MG PO TABS: 100.0000 mg | ORAL_TABLET | Freq: Every day | ORAL | Status: DC

## 2012-02-16 MED ORDER — ONDANSETRON 8 MG PO TBDP: 8.0000 mg | ORAL_TABLET | Freq: Three times a day (TID) | ORAL | Status: AC | PRN

## 2012-02-16 NOTE — Progress Notes (Signed)
  Subjective:    Patient ID: Kristin Parks, female    DOB: 05-Oct-1986, 26 y.o.   MRN: [id number]  HPI  Followup panic disorder. Stable. Patient on sertraline 100 mg daily.. 25 weeks ago had premature daughter. Things are going well. Daughter recently out of hospital. Patient needs Tdap.    She also has acute issue of nausea vomiting and diarrhea for a little over 24 hours. Diffuse bodyaches. Low-grade fever. Around others with similar symptoms. Denies any major headache.   Review of Systems As per history of present illness    Objective:   Physical Exam  Constitutional: She appears well-developed and well-nourished.  HENT:  Mouth/Throat: Oropharynx is clear and moist.  Neck: Neck supple. No thyromegaly present.  Cardiovascular: Normal rate and regular rhythm.   Pulmonary/Chest: Effort normal and breath sounds normal. No respiratory distress. She has no wheezes. She has no rales.  Abdominal: Soft. She exhibits no distension and no mass. There is no tenderness. There is no rebound and no guarding.  Lymphadenopathy:    She has no cervical adenopathy.          Assessment & Plan:  #1 panic disorder. Stable. Continue current medication with refills given for one year  #2 acute gastroenteritis. Zofran 8 mg every 8 hours when necessary nausea and vomiting

## 2012-02-22 ENCOUNTER — Encounter (HOSPITAL_COMMUNITY)
Admission: RE | Admit: 2012-02-22 | Discharge: 2012-02-22 | Disposition: A | Discharge status: Home / Self Care | Payer: BC Managed Care – PPO | Source: Ambulatory Visit | Attending: Certified Nurse Midwife | Admitting: Certified Nurse Midwife

## 2012-02-22 DIAGNOSIS — O923 Agalactia: Secondary | ICD-10-CM | POA: Insufficient documentation

## 2012-03-24 ENCOUNTER — Encounter (HOSPITAL_COMMUNITY)
Admission: RE | Admit: 2012-03-24 | Discharge: 2012-03-24 | Disposition: A | Discharge status: Home / Self Care | Payer: BC Managed Care – PPO | Source: Ambulatory Visit | Attending: Certified Nurse Midwife | Admitting: Certified Nurse Midwife

## 2012-03-24 DIAGNOSIS — O923 Agalactia: Secondary | ICD-10-CM | POA: Insufficient documentation

## 2012-04-24 ENCOUNTER — Other Ambulatory Visit: Payer: Self-pay | Admitting: *Deleted

## 2012-04-24 ENCOUNTER — Encounter (HOSPITAL_COMMUNITY)
Admission: RE | Admit: 2012-04-24 | Discharge: 2012-04-24 | Disposition: A | Discharge status: Home / Self Care | Payer: BC Managed Care – PPO | Source: Ambulatory Visit | Attending: Certified Nurse Midwife | Admitting: Certified Nurse Midwife

## 2012-04-24 DIAGNOSIS — O923 Agalactia: Secondary | ICD-10-CM | POA: Insufficient documentation

## 2012-04-24 MED ORDER — SERTRALINE HCL 100 MG PO TABS: 100.0000 mg | ORAL_TABLET | Freq: Every day | ORAL | Status: DC

## 2012-05-25 ENCOUNTER — Encounter (HOSPITAL_COMMUNITY)
Admission: RE | Admit: 2012-05-25 | Discharge: 2012-05-25 | Disposition: A | Discharge status: Home / Self Care | Payer: BC Managed Care – PPO | Source: Ambulatory Visit | Attending: Certified Nurse Midwife | Admitting: Certified Nurse Midwife

## 2012-05-25 DIAGNOSIS — O923 Agalactia: Secondary | ICD-10-CM | POA: Insufficient documentation

## 2012-07-26 ENCOUNTER — Encounter: Payer: Self-pay | Admitting: Family Medicine

## 2012-07-26 ENCOUNTER — Ambulatory Visit (INDEPENDENT_AMBULATORY_CARE_PROVIDER_SITE_OTHER): Payer: BC Managed Care – PPO | Admitting: Family Medicine

## 2012-07-26 VITALS — BP 100/62 | Temp 98.3°F | Wt 148.0 lb

## 2012-07-26 DIAGNOSIS — L659 Nonscarring hair loss, unspecified: Secondary | ICD-10-CM

## 2012-07-26 DIAGNOSIS — D509 Iron deficiency anemia, unspecified: Secondary | ICD-10-CM

## 2012-07-26 DIAGNOSIS — R5381 Other malaise: Secondary | ICD-10-CM

## 2012-07-26 DIAGNOSIS — F410 Panic disorder [episodic paroxysmal anxiety] without agoraphobia: Secondary | ICD-10-CM

## 2012-07-26 DIAGNOSIS — Z23 Encounter for immunization: Secondary | ICD-10-CM

## 2012-07-26 DIAGNOSIS — R5383 Other fatigue: Secondary | ICD-10-CM

## 2012-07-26 LAB — CBC WITH DIFFERENTIAL/PLATELET
Eosinophils Absolute: 0 10*3/uL (ref 0.0–0.7)
Eosinophils Relative: 0.8 % (ref 0.0–5.0)
HCT: 36.9 % (ref 36.0–46.0)
Hemoglobin: 12.4 g/dL (ref 12.0–15.0)
Lymphocytes Relative: 24.8 % (ref 12.0–46.0)
MCHC: 33.6 g/dL (ref 30.0–36.0)
Monocytes Absolute: 0.3 10*3/uL (ref 0.1–1.0)
Neutrophils Relative %: 69.1 % (ref 43.0–77.0)
Platelets: 336 10*3/uL (ref 150.0–400.0)

## 2012-07-26 LAB — BASIC METABOLIC PANEL
Chloride: 107 mEq/L (ref 96–112)
GFR: 94.11 mL/min (ref >60.00)
Glucose, Bld: 89 mg/dL (ref 70–99)
Potassium: 4.2 mEq/L (ref 3.5–5.1)
Sodium: 140 mEq/L (ref 135–145)

## 2012-07-26 LAB — TSH: TSH: 0.77 u[IU]/mL (ref 0.35–5.50)

## 2012-07-26 NOTE — Addendum Note (Signed)
Addended by: Melchor Amour on: 07/26/2012 02:27 PM   Modules accepted: Orders

## 2012-07-26 NOTE — Progress Notes (Signed)
  Subjective:    Patient ID: Kristin Parks, female    DOB: Dec 10, 1985, 26 y.o.   MRN: [id number]  HPI  Patient has history of panic disorder. She has been on sertraline 100 mg daily for several years. She has recently had some progressive fatigue and wonders if some of this may be medication related. She had delivery of premature daughter about 8 months ago and had some anemia following that and has not had any recent blood work. She has strong family history of hypothyroidism. She's had some generalized alopecia, progressive fatigue, and constipation. Denies depression. She realizes that some of her fatigue may be stress related with trying to work and single-parent. Does have excellent family support  Occasionally lightheaded with standing but no significant orthostasis. No syncope. Panic symptoms have been very well controlled on sertraline 100 mg.  Past Medical History  Diagnosis Date  . ANEMIA-IRON DEFICIENCY 10/23/2009  . DEPRESSION 07/08/2010  . PANIC DISORDER 10/23/2009  . High risk HPV infection    Past Surgical History  Procedure Date  . Mouth surgery   . Wisdom tooth extraction   . Cesarean section 11/19/2011    Procedure: CESAREAN SECTION;  Surgeon: Serita Kyle, MD;  Location: [facility] ORS;  Service: Gynecology;  Laterality: N/A;  Primary    reports that she has never smoked. She has never used smokeless tobacco. She reports that she does not drink alcohol or use illicit drugs. family history includes Arthritis in her maternal grandfather, maternal grandmother, and mother and Hypertension in her maternal grandfather, maternal grandmother, paternal grandfather, and paternal grandmother.  There is no history of Cancer. No Known Allergies    Review of Systems  Constitutional: Positive for fatigue. Negative for fever, appetite change and unexpected weight change.  Eyes: Negative for visual disturbance.  Respiratory: Negative for shortness of breath.   Cardiovascular: Negative  for chest pain and leg swelling.  Gastrointestinal: Negative for abdominal pain.  Neurological: Negative for syncope, weakness and headaches.  Hematological: Negative for adenopathy. Does not bruise/bleed easily.       Objective:   Physical Exam  Constitutional: She appears well-developed and well-nourished.  HENT:  Mouth/Throat: Oropharynx is clear and moist.  Neck: Neck supple. No thyromegaly present.  Cardiovascular: Normal rate and regular rhythm.   No murmur heard. Pulmonary/Chest: Effort normal and breath sounds normal. No respiratory distress. She has no wheezes. She has no rales.  Musculoskeletal: She exhibits no edema.  Lymphadenopathy:    She has no cervical adenopathy.  Psychiatric: She has a normal mood and affect. Her behavior is normal.          Assessment & Plan:  #1 fatigue. Question multifactorial. Question stress related. Try reducing sertraline to 50 mg daily. Rule out hypothyroidism. Check TSH. History of anemia though clinically appears low likelihood at this time. Check CBC. Check basic metabolic panel. If all labs normal reassess 3-4 weeks after reduction and sertraline #2 history of panic disorder. This has been well controlled on sertraline 100 mg daily. Patient requesting reduction dose. Try tapering to 50 mg and touch base if any breakthrough anxiety symptoms.

## 2012-07-26 NOTE — Patient Instructions (Addendum)
Reduce Sertraline 100 mg to one half tablet daily Touch base in 3-4 weeks if fatigue no better.

## 2012-07-27 NOTE — Progress Notes (Signed)
Quick Note:  Pt informed ______ 

## 2013-03-24 ENCOUNTER — Encounter (HOSPITAL_COMMUNITY): Payer: Self-pay | Admitting: *Deleted

## 2013-03-24 ENCOUNTER — Emergency Department (HOSPITAL_COMMUNITY)
Admission: EM | Admit: 2013-03-24 | Discharge: 2013-03-25 | Disposition: A | Discharge status: Home / Self Care | Payer: BC Managed Care – PPO | Attending: Emergency Medicine | Admitting: Emergency Medicine

## 2013-03-24 DIAGNOSIS — F329 Major depressive disorder, single episode, unspecified: Secondary | ICD-10-CM | POA: Insufficient documentation

## 2013-03-24 DIAGNOSIS — F410 Panic disorder [episodic paroxysmal anxiety] without agoraphobia: Secondary | ICD-10-CM | POA: Insufficient documentation

## 2013-03-24 DIAGNOSIS — IMO0002 Reserved for concepts with insufficient information to code with codable children: Secondary | ICD-10-CM

## 2013-03-24 DIAGNOSIS — R1110 Vomiting, unspecified: Secondary | ICD-10-CM | POA: Insufficient documentation

## 2013-03-24 DIAGNOSIS — F3289 Other specified depressive episodes: Secondary | ICD-10-CM | POA: Insufficient documentation

## 2013-03-24 DIAGNOSIS — F1010 Alcohol abuse, uncomplicated: Secondary | ICD-10-CM | POA: Insufficient documentation

## 2013-03-24 DIAGNOSIS — Z8619 Personal history of other infectious and parasitic diseases: Secondary | ICD-10-CM | POA: Insufficient documentation

## 2013-03-24 DIAGNOSIS — Z862 Personal history of diseases of the blood and blood-forming organs and certain disorders involving the immune mechanism: Secondary | ICD-10-CM | POA: Insufficient documentation

## 2013-03-24 DIAGNOSIS — Z9181 History of falling: Secondary | ICD-10-CM | POA: Insufficient documentation

## 2013-03-24 DIAGNOSIS — F1210 Cannabis abuse, uncomplicated: Secondary | ICD-10-CM | POA: Insufficient documentation

## 2013-03-24 LAB — POCT I-STAT, CHEM 8
BUN: 8 mg/dL (ref 6–23)
Calcium, Ion: 1.15 mmol/L (ref 1.12–1.23)
Chloride: 110 mEq/L (ref 96–112)
Creatinine, Ser: 1 mg/dL (ref 0.50–1.10)
Glucose, Bld: 108 mg/dL — ABNORMAL HIGH (ref 70–99)
HCT: 38 % (ref 36.0–46.0)
Hemoglobin: 12.9 g/dL (ref 12.0–15.0)
Potassium: 3.8 mEq/L (ref 3.5–5.1)
Sodium: 144 mEq/L (ref 135–145)
TCO2: 23 mmol/L (ref 0–100)

## 2013-03-24 MED ORDER — SODIUM CHLORIDE 0.9 % IV BOLUS (SEPSIS)
1000.0000 mL | Freq: Once | INTRAVENOUS | Status: AC
  Administered 2013-03-24: 1000 mL via INTRAVENOUS

## 2013-03-24 NOTE — ED Notes (Signed)
Pt called EMS for nausea and vomiting. Upon arrival, pt admits to ETOH and marijuana use. Pt behaving in bizarre manner. PIV #20 placed per EMS, Zofran 4 mg IV administered prior to arrival. Pt has intentionally fallen from chair on multiple occasions both prior to arrival and upon arrival to the ED.

## 2013-03-24 NOTE — ED Notes (Signed)
ONG:[id number]<UX> Expected date:<BR> Expected time:<BR> Means of arrival:<BR> Comments:<BR> EMS/smoked marijuana-uncontrolled behavior-nausea-Zofran per EMS

## 2013-03-29 NOTE — ED Provider Notes (Signed)
History    27 year old female brought in with intoxication. Patient was at a wedding and drinking alcohol and also smokes marijuana. Patient apparently was having some bizarre behavior and family members concerned that the marijuana may have been laced with something. Apparently other people that smoke the same marijuana did not have a similar reaction though. Patient appears to be intoxicated. For asking me if she is going to die. She does not appear to be any acute distress though. Patient has fallen to the or from a seated position several times. She appears to be will fully doing this though as she is fairly alert enhanced questions appropriately. No reported trauma otherwise. Patient with a past history of depression and panic disorder.  CSN: [id number]  Arrival date & time 03/24/13  2024   First MD Initiated Contact with Patient 03/24/13 2100      Chief Complaint  Patient presents with  . Alcohol Intoxication  . Emesis    (Consider location/radiation/quality/duration/timing/severity/associated sxs/prior treatment) HPI  Past Medical History  Diagnosis Date  . ANEMIA-IRON DEFICIENCY 10/23/2009  . DEPRESSION 07/08/2010  . PANIC DISORDER 10/23/2009  . High risk HPV infection     Past Surgical History  Procedure Laterality Date  . Mouth surgery    . Wisdom tooth extraction    . Cesarean section  11/19/2011    Procedure: CESAREAN SECTION;  Surgeon: Serita Kyle, MD;  Location: [facility];  Service: Gynecology;  Laterality: N/A;  Primary    Family History  Problem Relation Age of Onset  . Arthritis Mother   . Hypertension Maternal Grandmother   . Arthritis Maternal Grandmother   . Hypertension Maternal Grandfather   . Arthritis Maternal Grandfather   . Hypertension Paternal Grandmother   . Hypertension Paternal Grandfather   . Cancer Neg Hx     grandfather - colon , grandmother - breast , grandfather - leukemia    History  Substance Use Topics  . Smoking status:  Never Smoker   . Smokeless tobacco: Never Used  . Alcohol Use: No    OB History   Grav Para Term Preterm Abortions TAB SAB Ect Mult Living   1 1  1      1       Review of Systems  All systems reviewed and negative, other than as noted in HPI.   Allergies  Codeine  Home Medications   Current Outpatient Rx  Name  Route  Sig  Dispense  Refill  . ibuprofen (ADVIL,MOTRIN) 200 MG tablet   Oral   Take 200 mg by mouth every 6 (six) hours as needed for pain.         Marland Kitchen norethindrone-ethinyl estradiol (JUNEL FE,GILDESS FE,LOESTRIN FE) 1-20 MG-MCG tablet   Oral   Take 1 tablet by mouth daily. Per OB/GYN         . sertraline (ZOLOFT) 100 MG tablet   Oral   Take 1 tablet (100 mg total) by mouth daily.   30 tablet   11     BP 97/62  Pulse 75  Temp(Src) 98.8 F (37.1 C) (Oral)  Resp 18  SpO2 100%  Physical Exam  Nursing note and vitals reviewed. Constitutional: She appears well-developed and well-nourished. No distress.  HENT:  Head: Normocephalic and atraumatic.  Eyes: EOM are normal. Pupils are equal, round, and reactive to light. Right eye exhibits no discharge. Left eye exhibits no discharge.  Conjunctival injection bilaterally  Neck: Neck supple.  Cardiovascular: Normal rate, regular rhythm and normal heart  sounds.  Exam reveals no gallop and no friction rub.   No murmur heard. Pulmonary/Chest: Effort normal and breath sounds normal. No respiratory distress.  Abdominal: Soft. She exhibits no distension. There is no tenderness.  Musculoskeletal: She exhibits no edema and no tenderness.  Neurological: She is alert. No cranial nerve deficit. She exhibits normal muscle tone. Coordination normal.  Skin: Skin is warm and dry.  Psychiatric: Thought content normal.  Appears to be mild to moderately intoxicated. Speech mildly slurred but understandable. Rate her speech is mildly slowed as well. She is responding to questions appropriately.    ED Course  Procedures  (including critical care time)  Labs Reviewed  POCT I-STAT, CHEM 8 - Abnormal; Notable for the following:    Glucose, Bld 108 (*)    All other components within normal limits   No results found.   1. Intoxication       MDM  27 year old female with intoxication. Patient was observed for a period of several hours with continued improvement in her mental status. She is being discharged in the care of her mother-in-law and husband. Emergent return precautions discussed.        Raeford Razor, MD 03/29/13 [phone]

## 2013-04-05 ENCOUNTER — Other Ambulatory Visit: Payer: Self-pay | Admitting: *Deleted

## 2013-04-05 MED ORDER — SERTRALINE HCL 100 MG PO TABS: 100.0000 mg | ORAL_TABLET | Freq: Every day | ORAL | Status: DC

## 2013-05-09 ENCOUNTER — Ambulatory Visit (INDEPENDENT_AMBULATORY_CARE_PROVIDER_SITE_OTHER): Payer: BC Managed Care – PPO | Admitting: Family Medicine

## 2013-05-09 ENCOUNTER — Encounter: Payer: Self-pay | Admitting: Family Medicine

## 2013-05-09 VITALS — BP 108/80 | HR 77 | Temp 98.3°F | Wt 153.0 lb

## 2013-05-09 DIAGNOSIS — F410 Panic disorder [episodic paroxysmal anxiety] without agoraphobia: Secondary | ICD-10-CM

## 2013-05-09 DIAGNOSIS — K219 Gastro-esophageal reflux disease without esophagitis: Secondary | ICD-10-CM

## 2013-05-09 MED ORDER — SERTRALINE HCL 50 MG PO TABS: 50.0000 mg | ORAL_TABLET | Freq: Every day | ORAL | Status: DC

## 2013-05-09 MED ORDER — PANTOPRAZOLE SODIUM 40 MG PO TBEC: 40.0000 mg | DELAYED_RELEASE_TABLET | Freq: Every day | ORAL | Status: DC

## 2013-05-09 NOTE — Progress Notes (Signed)
  Subjective:    Patient ID: Kristin Parks, female    DOB: Sep 17, 1986, 27 y.o.   MRN: [id number]  HPI Patient here for followup regarding anxiety disorder with panic disorder She reduced sertraline 50 mg daily and anxiety symptoms are unstable thus far No recent depression issues.  Progressive GERD symptoms over the past few months. Frequent burning sensation substernally upper sternal notch. Starts near her epigastric area. No hematemesis. No appetite or weight changes. Over-the-counter Zantac without relief. She is recently had occasional pain with swallowing but no true dysphagia.  Patient nonsmoker. No history of peptic ulcer disease. Only occasional alcohol use.  Past Medical History  Diagnosis Date  . ANEMIA-IRON DEFICIENCY 10/23/2009  . DEPRESSION 07/08/2010  . PANIC DISORDER 10/23/2009  . High risk HPV infection    Past Surgical History  Procedure Laterality Date  . Mouth surgery    . Wisdom tooth extraction    . Cesarean section  11/19/2011    Procedure: CESAREAN SECTION;  Surgeon: Serita Kyle, MD;  Location: [facility];  Service: Gynecology;  Laterality: N/A;  Primary    reports that she has never smoked. She has never used smokeless tobacco. She reports that she does not drink alcohol or use illicit drugs. family history includes Arthritis in her maternal grandfather, maternal grandmother, and mother and Hypertension in her maternal grandfather, maternal grandmother, paternal grandfather, and paternal grandmother.  There is no history of Cancer. Allergies  Allergen Reactions  . Codeine Itching      Review of Systems  Constitutional: Negative for fever, chills, appetite change and unexpected weight change.  Respiratory: Negative for cough and shortness of breath.   Cardiovascular: Negative for chest pain.  Gastrointestinal: Positive for abdominal pain and constipation. Negative for nausea, vomiting, diarrhea, blood in stool and abdominal distention.   Psychiatric/Behavioral: Negative for dysphoric mood.       Objective:   Physical Exam  Constitutional: She appears well-developed and well-nourished.  Neck: Neck supple. No thyromegaly present.  Cardiovascular: Normal rate and regular rhythm.   Pulmonary/Chest: Effort normal and breath sounds normal. No respiratory distress. She has no wheezes. She has no rales.  Abdominal: Soft. Bowel sounds are normal.  Minimally tender epigastric region. No guarding or rebound. No hepatomegaly or splenomegaly          Assessment & Plan:  #1 GERD. Lifestyle factors discussed. Handout given. Protonix 40 mg once daily. She is instructed to touch base with [place] in 2-3 weeks if not seeing resolution of symptoms. She will take this for 4-6 weeks and taper off as tolerated. Followup for any breakthrough symptoms #2 history of anxiety disorder with panic disorder. Refill sertraline. Currently stable.

## 2013-05-09 NOTE — Patient Instructions (Addendum)
Gastroesophageal Reflux Disease, Adult  Gastroesophageal reflux disease (GERD) happens when acid from your stomach flows up into the esophagus. When acid comes in contact with the esophagus, the acid causes soreness (inflammation) in the esophagus. Over time, GERD may create small holes (ulcers) in the lining of the esophagus.  CAUSES   · Increased body weight. This puts pressure on the stomach, making acid rise from the stomach into the esophagus.  · Smoking. This increases acid production in the stomach.  · Drinking alcohol. This causes decreased pressure in the lower esophageal sphincter (valve or ring of muscle between the esophagus and stomach), allowing acid from the stomach into the esophagus.  · Late evening meals and a full stomach. This increases pressure and acid production in the stomach.  · A malformed lower esophageal sphincter.  Sometimes, no cause is found.  SYMPTOMS   · Burning pain in the lower part of the mid-chest behind the breastbone and in the mid-stomach area. This may occur twice a week or more often.  · Trouble swallowing.  · Sore throat.  · Dry cough.  · Asthma-like symptoms including chest tightness, shortness of breath, or wheezing.  DIAGNOSIS   Your caregiver may be able to diagnose GERD based on your symptoms. In some cases, X-rays and other tests may be done to check for complications or to check the condition of your stomach and esophagus.  TREATMENT   Your caregiver may recommend over-the-counter or prescription medicines to help decrease acid production. Ask your caregiver before starting or adding any new medicines.   HOME CARE INSTRUCTIONS   · Change the factors that you can control. Ask your caregiver for guidance concerning weight loss, quitting smoking, and alcohol consumption.  · Avoid foods and drinks that make your symptoms worse, such as:  · Caffeine or alcoholic drinks.  · Chocolate.  · Peppermint or mint flavorings.  · Garlic and onions.  · Spicy foods.  · Citrus fruits,  such as oranges, lemons, or limes.  · Tomato-based foods such as sauce, chili, salsa, and pizza.  · Fried and fatty foods.  · Avoid lying down for the 3 hours prior to your bedtime or prior to taking a nap.  · Eat small, frequent meals instead of large meals.  · Wear loose-fitting clothing. Do not wear anything tight around your waist that causes pressure on your stomach.  · Raise the head of your bed 6 to 8 inches with wood blocks to help you sleep. Extra pillows will not help.  · Only take over-the-counter or prescription medicines for pain, discomfort, or fever as directed by your caregiver.  · Do not take aspirin, ibuprofen, or other nonsteroidal anti-inflammatory drugs (NSAIDs).  SEEK IMMEDIATE MEDICAL CARE IF:   · You have pain in your arms, neck, jaw, teeth, or back.  · Your pain increases or changes in intensity or duration.  · You develop nausea, vomiting, or sweating (diaphoresis).  · You develop shortness of breath, or you faint.  · Your vomit is green, yellow, black, or looks like coffee grounds or blood.  · Your stool is red, bloody, or black.  These symptoms could be signs of other problems, such as heart disease, gastric bleeding, or esophageal bleeding.  MAKE SURE YOU:   · Understand these instructions.  · Will watch your condition.  · Will get help right away if you are not doing well or get worse.  Document Released: 07/21/2005 Document Revised: 01/03/2012 Document Reviewed: 04/30/2011  [employer]® Patient   Information ©2014 [employer].

## 2013-08-21 ENCOUNTER — Encounter: Payer: Self-pay | Admitting: Internal Medicine

## 2013-08-21 ENCOUNTER — Ambulatory Visit (INDEPENDENT_AMBULATORY_CARE_PROVIDER_SITE_OTHER): Payer: BC Managed Care – PPO | Admitting: Internal Medicine

## 2013-08-21 VITALS — BP 102/60 | HR 74 | Temp 98.4°F | Resp 20 | Wt 149.0 lb

## 2013-08-21 DIAGNOSIS — Z23 Encounter for immunization: Secondary | ICD-10-CM

## 2013-08-21 DIAGNOSIS — J069 Acute upper respiratory infection, unspecified: Secondary | ICD-10-CM

## 2013-08-21 NOTE — Progress Notes (Signed)
Subjective:    Patient ID: Kristin Parks, female    DOB: 11/23/1985, 27 y.o.   MRN: [id number]  HPI  27 year old patient who presents with a three-day history of sore throat rhinorrhea and slightly tender cervical adenopathy. She describes some fatigue but no fever or chills. No productive cough. She does have a 16-year-old daughter at home who has also been ill. Allergies include codeine  Past Medical History  Diagnosis Date  . ANEMIA-IRON DEFICIENCY 10/23/2009  . DEPRESSION 07/08/2010  . PANIC DISORDER 10/23/2009  . High risk HPV infection     History   Social History  . Marital Status: Single    Spouse Name: N/A    Number of Children: N/A  . Years of Education: N/A   Occupational History  . Not on file.   Social History Main Topics  . Smoking status: Never Smoker   . Smokeless tobacco: Never Used  . Alcohol Use: No  . Drug Use: No  . Sexual Activity: Yes    Partners: Male   Other Topics Concern  . Not on file   Social History Narrative  . No narrative on file    Past Surgical History  Procedure Laterality Date  . Mouth surgery    . Wisdom tooth extraction    . Cesarean section  11/19/2011    Procedure: CESAREAN SECTION;  Surgeon: Serita Kyle, MD;  Location: [facility];  Service: Gynecology;  Laterality: N/A;  Primary    Family History  Problem Relation Age of Onset  . Arthritis Mother   . Hypertension Maternal Grandmother   . Arthritis Maternal Grandmother   . Hypertension Maternal Grandfather   . Arthritis Maternal Grandfather   . Hypertension Paternal Grandmother   . Hypertension Paternal Grandfather   . Cancer Neg Hx     grandfather - colon , grandmother - breast , grandfather - leukemia    Allergies  Allergen Reactions  . Codeine Itching    Current Outpatient Prescriptions on File Prior to Visit  Medication Sig Dispense Refill  . ibuprofen (ADVIL,MOTRIN) 200 MG tablet Take 200 mg by mouth every 6 (six) hours as needed for pain.      Marland Kitchen  norethindrone-ethinyl estradiol (JUNEL FE,GILDESS FE,LOESTRIN FE) 1-20 MG-MCG tablet Take 1 tablet by mouth daily. Per OB/GYN      . pantoprazole (PROTONIX) 40 MG tablet Take 1 tablet (40 mg total) by mouth daily.  30 tablet  3  . sertraline (ZOLOFT) 50 MG tablet Take 1 tablet (50 mg total) by mouth daily.  90 tablet  3   No current facility-administered medications on file prior to visit.    BP 102/60  Pulse 74  Temp(Src) 98.4 F (36.9 C) (Oral)  Resp 20  Wt 149 lb (67.586 kg)  BMI 24.79 kg/m2  SpO2 98%  LMP 08/07/2013  Breastfeeding? No       Review of Systems  Constitutional: Positive for activity change, appetite change and fatigue.  HENT: Positive for rhinorrhea and sore throat. Negative for congestion, dental problem, hearing loss, sinus pressure and tinnitus.   Eyes: Negative for pain, discharge and visual disturbance.  Respiratory: Negative for cough and shortness of breath.   Cardiovascular: Negative for chest pain, palpitations and leg swelling.  Gastrointestinal: Negative for nausea, vomiting, abdominal pain, diarrhea, constipation, blood in stool and abdominal distention.  Genitourinary: Negative for dysuria, urgency, frequency, hematuria, flank pain, vaginal bleeding, vaginal discharge, difficulty urinating, vaginal pain and pelvic pain.  Musculoskeletal: Negative for arthralgias, gait problem and  joint swelling.  Skin: Negative for rash.  Neurological: Negative for dizziness, syncope, speech difficulty, weakness, numbness and headaches.  Hematological: Negative for adenopathy.  Psychiatric/Behavioral: Negative for behavioral problems, dysphoric mood and agitation. The patient is not nervous/anxious.        Objective:   Physical Exam  Constitutional: She is oriented to person, place, and time. She appears well-developed and well-nourished.  HENT:  Head: Normocephalic.  Right Ear: External ear normal.  Left Ear: External ear normal.  Mild erythema of the  oropharynx  Eyes: Conjunctivae and EOM are normal. Pupils are equal, round, and reactive to light.  Neck: Normal range of motion. Neck supple. No thyromegaly present.  Very small but slightly tender cervical adenopathy left greater than the right  Cardiovascular: Normal rate, regular rhythm, normal heart sounds and intact distal pulses.   Pulmonary/Chest: Effort normal and breath sounds normal.  Abdominal: Soft. Bowel sounds are normal. She exhibits no mass. There is no tenderness.  Musculoskeletal: Normal range of motion.  Lymphadenopathy:    She has no cervical adenopathy.  Neurological: She is alert and oriented to person, place, and time.  Skin: Skin is warm and dry. No rash noted.  Psychiatric: She has a normal mood and affect. Her behavior is normal.          Assessment & Plan:   Viral URI. Will treat with ibuprofen and Tylenol. We'll consider a nonsedating antihistamine to assist with the sinus drainage.

## 2013-08-21 NOTE — Patient Instructions (Signed)
Use saline irrigation, warm  moist compresses and over-the-counter decongestants only as directed.  Call if there is no improvement in 5 to 7 days, or sooner if you develop increasing pain, fever, or any new symptoms. 

## 2013-09-16 ENCOUNTER — Other Ambulatory Visit: Payer: Self-pay | Admitting: Family Medicine

## 2013-11-08 ENCOUNTER — Other Ambulatory Visit: Payer: Self-pay | Admitting: Family Medicine

## 2013-11-14 ENCOUNTER — Ambulatory Visit (INDEPENDENT_AMBULATORY_CARE_PROVIDER_SITE_OTHER): Payer: BC Managed Care – PPO | Admitting: Family Medicine

## 2013-11-14 ENCOUNTER — Encounter: Payer: Self-pay | Admitting: Family Medicine

## 2013-11-14 VITALS — BP 102/70 | HR 88 | Temp 98.6°F | Wt 143.0 lb

## 2013-11-14 DIAGNOSIS — N390 Urinary tract infection, site not specified: Secondary | ICD-10-CM

## 2013-11-14 DIAGNOSIS — M549 Dorsalgia, unspecified: Secondary | ICD-10-CM

## 2013-11-14 LAB — POCT URINALYSIS DIPSTICK
Bilirubin, UA: NEGATIVE
GLUCOSE UA: NEGATIVE
Ketones, UA: NEGATIVE
NITRITE UA: POSITIVE
PH UA: 5.5
PROTEIN UA: NEGATIVE
Spec Grav, UA: 1.02
UROBILINOGEN UA: 0.2

## 2013-11-14 MED ORDER — NITROFURANTOIN MONOHYD MACRO 100 MG PO CAPS: 100.0000 mg | ORAL_CAPSULE | Freq: Two times a day (BID) | ORAL | Status: DC

## 2013-11-14 NOTE — Progress Notes (Signed)
   Subjective:    Patient ID: Kristin Parks, female    DOB: 05/30/1986, 28 y.o.   MRN: [id number]  HPI  Patient seen with low back pain and urinary symptoms of frequency and odor over the past week. No significant burning. Her back pain is fairly nonspecific and diffuse lower lumbar. She denies any fever, chills, nausea, or vomiting. She has not had a UTI in several years. She has allergy to codeine but no antibiotic allergies  Past Medical History  Diagnosis Date  . ANEMIA-IRON DEFICIENCY 10/23/2009  . DEPRESSION 07/08/2010  . PANIC DISORDER 10/23/2009  . High risk HPV infection    Past Surgical History  Procedure Laterality Date  . Mouth surgery    . Wisdom tooth extraction    . Cesarean section  11/19/2011    Procedure: CESAREAN SECTION;  Surgeon: Serita KyleSheronette A Cousins, MD;  Location: [facility];  Service: Gynecology;  Laterality: N/A;  Primary    reports that she has never smoked. She has never used smokeless tobacco. She reports that she does not drink alcohol or use illicit drugs. family history includes Arthritis in her maternal grandfather, maternal grandmother, and mother; Hypertension in her maternal grandfather, maternal grandmother, paternal grandfather, and paternal grandmother. There is no history of Cancer. Allergies  Allergen Reactions  . Codeine Itching     Review of Systems  Constitutional: Negative for fever, chills and appetite change.  Gastrointestinal: Negative for nausea, vomiting, abdominal pain, diarrhea and constipation.  Genitourinary: Positive for dysuria and frequency.  Musculoskeletal: Negative for back pain.  Neurological: Negative for dizziness.       Objective:   Physical Exam  Constitutional: She appears well-developed and well-nourished.  HENT:  Head: Normocephalic and atraumatic.  Neck: Neck supple. No thyromegaly present.  Cardiovascular: Normal rate, regular rhythm and normal heart sounds.   Pulmonary/Chest: Breath sounds normal.  Abdominal:  Soft. Bowel sounds are normal.          Assessment & Plan:  Uncomplicated cystitis. Macrobid one twice a day for 5 days. Followup promptly for any fever, vomiting, or increasing back pain.  She doesn't have any fever or flank tenderness to suggest pyelonephritis

## 2013-11-14 NOTE — Patient Instructions (Signed)
Urinary Tract Infection  Urinary tract infections (UTIs) can develop anywhere along your urinary tract. Your urinary tract is your body's drainage system for removing wastes and extra water. Your urinary tract includes two kidneys, two ureters, a bladder, and a urethra. Your kidneys are a pair of bean-shaped organs. Each kidney is about the size of your fist. They are located below your ribs, one on each side of your spine.  CAUSES  Infections are caused by microbes, which are microscopic organisms, including fungi, viruses, and bacteria. These organisms are so small that they can only be seen through a microscope. Bacteria are the microbes that most commonly cause UTIs.  SYMPTOMS   Symptoms of UTIs may vary by age and gender of the patient and by the location of the infection. Symptoms in young women typically include a frequent and intense urge to urinate and a painful, burning feeling in the bladder or urethra during urination. Older women and men are more likely to be tired, shaky, and weak and have muscle aches and abdominal pain. A fever may mean the infection is in your kidneys. Other symptoms of a kidney infection include pain in your back or sides below the ribs, nausea, and vomiting.  DIAGNOSIS  To diagnose a UTI, your caregiver will ask you about your symptoms. Your caregiver also will ask to provide a urine sample. The urine sample will be tested for bacteria and white blood cells. White blood cells are made by your body to help fight infection.  TREATMENT   Typically, UTIs can be treated with medication. Because most UTIs are caused by a bacterial infection, they usually can be treated with the use of antibiotics. The choice of antibiotic and length of treatment depend on your symptoms and the type of bacteria causing your infection.  HOME CARE INSTRUCTIONS   If you were prescribed antibiotics, take them exactly as your caregiver instructs you. Finish the medication even if you feel better after you  have only taken some of the medication.   Drink enough water and fluids to keep your urine clear or pale yellow.   Avoid caffeine, tea, and carbonated beverages. They tend to irritate your bladder.   Empty your bladder often. Avoid holding urine for long periods of time.   Empty your bladder before and after sexual intercourse.   After a bowel movement, women should cleanse from front to back. Use each tissue only once.  SEEK MEDICAL CARE IF:    You have back pain.   You develop a fever.   Your symptoms do not begin to resolve within 3 days.  SEEK IMMEDIATE MEDICAL CARE IF:    You have severe back pain or lower abdominal pain.   You develop chills.   You have nausea or vomiting.   You have continued burning or discomfort with urination.  MAKE SURE YOU:    Understand these instructions.   Will watch your condition.   Will get help right away if you are not doing well or get worse.  Document Released: 07/21/2005 Document Revised: 04/11/2012 Document Reviewed: 11/19/2011  [employer] Patient Information 2014 [employer].

## 2013-11-14 NOTE — Progress Notes (Signed)
Pre visit review using our clinic review tool, if applicable. No additional management support is needed unless otherwise documented below in the visit note. 

## 2014-03-17 ENCOUNTER — Other Ambulatory Visit: Payer: Self-pay | Admitting: Family Medicine

## 2014-04-11 ENCOUNTER — Ambulatory Visit (INDEPENDENT_AMBULATORY_CARE_PROVIDER_SITE_OTHER): Payer: BC Managed Care – PPO | Admitting: Family Medicine

## 2014-04-11 VITALS — BP 118/80 | Temp 98.9°F | Wt 139.0 lb

## 2014-04-11 DIAGNOSIS — K644 Residual hemorrhoidal skin tags: Secondary | ICD-10-CM

## 2014-04-11 MED ORDER — HYDROCORTISONE ACE-PRAMOXINE 1-1 % RE FOAM: 1.0000 | Freq: Three times a day (TID) | RECTAL | Status: DC

## 2014-04-11 NOTE — Patient Instructions (Signed)

## 2014-04-11 NOTE — Progress Notes (Signed)
   Subjective:    Patient ID: Kristin Parks, female    DOB: 11/29/1985, 29 y.o.   MRN: [id number]  HPI Patient seen for painful external hemorrhoids. She's had hemorrhoids in the past especially over the past several days. Occasional constipation. No bleeding. Pain with sitting. She's tried ice, sitz baths, preparation H., and Tucks pads without much relief. Ice provided the most relief. These are very painful with walking and standing. No history of thrombosed hemorrhoid.  Past Medical History  Diagnosis Date  . ANEMIA-IRON DEFICIENCY 10/23/2009  . DEPRESSION 07/08/2010  . PANIC DISORDER 10/23/2009  . High risk HPV infection    Past Surgical History  Procedure Laterality Date  . Mouth surgery    . Wisdom tooth extraction    . Cesarean section  11/19/2011    Procedure: CESAREAN SECTION;  Surgeon: Serita KyleSheronette A Cousins, MD;  Location: [facility];  Service: Gynecology;  Laterality: N/A;  Primary    reports that she has never smoked. She has never used smokeless tobacco. She reports that she does not drink alcohol or use illicit drugs. family history includes Arthritis in her maternal grandfather, maternal grandmother, and mother; Hypertension in her maternal grandfather, maternal grandmother, paternal grandfather, and paternal grandmother. There is no history of Cancer. Allergies  Allergen Reactions  . Codeine Itching      Review of Systems  Gastrointestinal: Negative for nausea, vomiting, abdominal pain, diarrhea and blood in stool.       Objective:   Physical Exam  Constitutional: She appears well-developed and well-nourished.  Cardiovascular: Normal rate.   Pulmonary/Chest: Effort normal and breath sounds normal. No respiratory distress. She has no wheezes.  Genitourinary:  Rectal exam reveals swollen nonthrombosed external hemorrhoids. No bleeding. Very tender to palpation. No surrounding erythema.          Assessment & Plan:  Nonthrombosed external hemorrhoids. Measures to  reduce constipation discussed. Continue sitz baths. She'll continue icing for symptomatic relief. ProctoFoam-HC 3-4 times daily. Consider referral if symptoms persist

## 2014-04-11 NOTE — Progress Notes (Signed)
Pre visit review using our clinic review tool, if applicable. No additional management support is needed unless otherwise documented below in the visit note. 

## 2014-06-11 ENCOUNTER — Other Ambulatory Visit: Payer: Self-pay | Admitting: Family Medicine

## 2014-06-23 ENCOUNTER — Emergency Department (HOSPITAL_COMMUNITY)
Admission: EM | Admit: 2014-06-23 | Discharge: 2014-06-23 | Disposition: A | Discharge status: Home / Self Care | Payer: BC Managed Care – PPO | Attending: Emergency Medicine | Admitting: Emergency Medicine

## 2014-06-23 ENCOUNTER — Encounter (HOSPITAL_COMMUNITY): Payer: Self-pay | Admitting: Emergency Medicine

## 2014-06-23 DIAGNOSIS — F329 Major depressive disorder, single episode, unspecified: Secondary | ICD-10-CM | POA: Insufficient documentation

## 2014-06-23 DIAGNOSIS — Z3202 Encounter for pregnancy test, result negative: Secondary | ICD-10-CM | POA: Diagnosis not present

## 2014-06-23 DIAGNOSIS — F3289 Other specified depressive episodes: Secondary | ICD-10-CM | POA: Diagnosis not present

## 2014-06-23 DIAGNOSIS — D509 Iron deficiency anemia, unspecified: Secondary | ICD-10-CM | POA: Diagnosis not present

## 2014-06-23 DIAGNOSIS — F410 Panic disorder [episodic paroxysmal anxiety] without agoraphobia: Secondary | ICD-10-CM | POA: Insufficient documentation

## 2014-06-23 DIAGNOSIS — R110 Nausea: Secondary | ICD-10-CM | POA: Insufficient documentation

## 2014-06-23 DIAGNOSIS — Z8619 Personal history of other infectious and parasitic diseases: Secondary | ICD-10-CM | POA: Diagnosis not present

## 2014-06-23 DIAGNOSIS — Z79899 Other long term (current) drug therapy: Secondary | ICD-10-CM | POA: Diagnosis not present

## 2014-06-23 DIAGNOSIS — R109 Unspecified abdominal pain: Secondary | ICD-10-CM | POA: Diagnosis present

## 2014-06-23 DIAGNOSIS — N390 Urinary tract infection, site not specified: Secondary | ICD-10-CM | POA: Insufficient documentation

## 2014-06-23 LAB — URINALYSIS, ROUTINE W REFLEX MICROSCOPIC
Bilirubin Urine: NEGATIVE
GLUCOSE, UA: NEGATIVE mg/dL
Ketones, ur: NEGATIVE mg/dL
Nitrite: POSITIVE — AB
PROTEIN: 100 mg/dL — AB
Specific Gravity, Urine: 1.017 (ref 1.005–1.030)
UROBILINOGEN UA: 1 mg/dL (ref 0.0–1.0)
pH: 6 (ref 5.0–8.0)

## 2014-06-23 LAB — URINE MICROSCOPIC-ADD ON

## 2014-06-23 LAB — PREGNANCY, URINE: Preg Test, Ur: NEGATIVE

## 2014-06-23 MED ORDER — ONDANSETRON 4 MG PO TBDP: 4.0000 mg | ORAL_TABLET | Freq: Three times a day (TID) | ORAL | Status: DC | PRN

## 2014-06-23 MED ORDER — CEFTRIAXONE SODIUM 250 MG IJ SOLR
250.0000 mg | Freq: Once | INTRAMUSCULAR | Status: AC
  Administered 2014-06-23: 250 mg via INTRAMUSCULAR
  Filled 2014-06-23: qty 250

## 2014-06-23 MED ORDER — HYDROCODONE-ACETAMINOPHEN 5-325 MG PO TABS
1.0000 | ORAL_TABLET | Freq: Four times a day (QID) | ORAL | Status: DC | PRN
Start: 2014-06-23 — End: 2014-10-07

## 2014-06-23 MED ORDER — LIDOCAINE HCL (PF) 1 % IJ SOLN
INTRAMUSCULAR | Status: AC
  Administered 2014-06-23: 2 mL
  Filled 2014-06-23: qty 5

## 2014-06-23 MED ORDER — NAPROXEN 500 MG PO TABS: 500.0000 mg | ORAL_TABLET | Freq: Two times a day (BID) | ORAL | Status: DC | PRN

## 2014-06-23 MED ORDER — CEPHALEXIN 500 MG PO CAPS: ORAL_CAPSULE | ORAL | Status: DC

## 2014-06-23 MED ORDER — LIDOCAINE HCL (PF) 1 % IJ SOLN
2.0000 mL | Freq: Once | INTRAMUSCULAR | Status: AC
  Administered 2014-06-23: 2 mL

## 2014-06-23 NOTE — ED Provider Notes (Signed)
CSN: [id number]     Arrival date & time 06/23/14  2040 History  This chart was scribed for non-physician practitioner, Allen Derry, PA-C working with Gilda Crease, MD by Greggory Stallion, ED scribe. This patient was seen in room TR05C/TR05C and the patient's care was started at 10:09 PM.    Chief Complaint  Patient presents with  . Urinary Tract Infection   Patient is a 28 y.o. female presenting with urinary tract infection. The history is provided by the patient. No language interpreter was used.  Urinary Tract Infection This is a new problem. The current episode started more than 2 days ago. The problem occurs constantly. The problem has not changed since onset.Pertinent negatives include no chest pain, no abdominal pain, no headaches and no shortness of breath. Nothing aggravates the symptoms. The symptoms are relieved by acetaminophen and heat. She has tried a warm compress and acetaminophen for the symptoms. The treatment provided mild relief.  Urinary Tract Infection This is a new problem. The current episode started more than 2 days ago. The problem occurs constantly. The problem has not changed since onset.Associated symptoms include nausea and urinary symptoms. Pertinent negatives include no abdominal pain, change in bowel habit, chest pain, chills, diaphoresis, fatigue, fever, headaches, myalgias, rash, vomiting or weakness. Nothing aggravates the symptoms. She has tried a warm compress and acetaminophen for the symptoms. The treatment provided mild relief.   HPI Comments: Kristin Parks is a 28 y.o. female with a PMHx of iron deficiency anemia, who presents to the Emergency Department complaining of dysuria and urinary frequency and urgency that started several days ago. Reports right lower back pain and nausea that started yesterday. Back pain does not radiate. Rates it 8/10. Pt has taken 400 mg ibuprofen and used a heating pad with some relief of pain. Rates pain 5/10 after  taking medication and using the heating pad. Pt has taken AZO for urinary symptoms with little relief. Denies fever, chest pain, SOB, abdominal pain, vomiting, hematuria, vaginal discharge, vaginal bleeding, urinary odor, headache, weakness. Denies history of kidney infection. LMP was the end of last month. Denies recent travel. Pt drinks alcohol occasionally.   Past Medical History  Diagnosis Date  . ANEMIA-IRON DEFICIENCY 10/23/2009  . DEPRESSION 07/08/2010  . PANIC DISORDER 10/23/2009  . High risk HPV infection    Past Surgical History  Procedure Laterality Date  . Mouth surgery    . Wisdom tooth extraction    . Cesarean section  11/19/2011    Procedure: CESAREAN SECTION;  Surgeon: Serita Kyle, MD;  Location: [facility];  Service: Gynecology;  Laterality: N/A;  Primary   Family History  Problem Relation Age of Onset  . Arthritis Mother   . Hypertension Maternal Grandmother   . Arthritis Maternal Grandmother   . Hypertension Maternal Grandfather   . Arthritis Maternal Grandfather   . Hypertension Paternal Grandmother   . Hypertension Paternal Grandfather   . Cancer Neg Hx     grandfather - colon , grandmother - breast , grandfather - leukemia   History  Substance Use Topics  . Smoking status: Never Smoker   . Smokeless tobacco: Never Used  . Alcohol Use: No   OB History   Grav Para Term Preterm Abortions TAB SAB Ect Mult Living   Review of Systems  Constitutional: Negative for fever, chills, diaphoresis and fatigue.  Respiratory: Negative for shortness of breath.   Cardiovascular:  Negative for chest pain.  Gastrointestinal: Positive for nausea. Negative for vomiting, abdominal pain and change in bowel habit.  Genitourinary: Positive for dysuria and frequency. Negative for vaginal bleeding and vaginal discharge.  Musculoskeletal: Positive for back pain. Negative for myalgias.  Skin: Negative for rash.  Neurological: Negative for weakness and  headaches.   10 Systems reviewed and are negative for acute change except as noted in the HPI.  Allergies  Codeine  Home Medications   Prior to Admission medications   Medication Sig Start Date End Date Taking? Authorizing Provider  cephALEXin (KEFLEX) 500 MG capsule 2 caps po bid x 7 days 06/23/14   Irania Durell Strupp Camprubi-Soms, PA-C  HYDROcodone-acetaminophen (NORCO) 5-325 MG per tablet Take 1-2 tablets by mouth every 6 (six) hours as needed for severe pain. 06/23/14   Toniette Devera Strupp Camprubi-Soms, PA-C  hydrocortisone-pramoxine [facility]) rectal foam Place 1 applicator rectally 3 (three) times daily. 04/11/14   [name], MD  naproxen (NAPROSYN) 500 MG tablet Take 1 tablet (500 mg total) by mouth 2 (two) times daily as needed for mild pain, moderate pain or headache (TAKE WITH MEALS.). 06/23/14   Sohana Austell Strupp Camprubi-Soms, PA-C  norethindrone-ethinyl estradiol (JUNEL FE,GILDESS FE,LOESTRIN FE) 1-20 MG-MCG tablet Take 1 tablet by mouth daily. Per OB/GYN    Historical Provider, MD  ondansetron (ZOFRAN ODT) 4 MG disintegrating tablet Take 1 tablet (4 mg total) by mouth every 8 (eight) hours as needed for nausea or vomiting. 06/23/14   Donnita Falls Camprubi-Soms, PA-C  pantoprazole (PROTONIX) 40 MG tablet TAKE 1 TABLET (40 MG TOTAL) BY MOUTH DAILY. 09/16/13   [name], MD  pantoprazole (PROTONIX) 40 MG tablet TAKE 1 TABLET (40 MG TOTAL) BY MOUTH DAILY.    [name], MD  sertraline (ZOLOFT) 50 MG tablet TAKE 1 TABLET BY MOUTH DAILY 06/11/14   [name], MD   BP 115/72  Pulse 67  Temp(Src) 98.4 F (37 C) (Oral)  Resp 16  Ht  (1.651 m)  Wt 132 lb (59.875 kg)  BMI 21.97 kg/m2  SpO2 100%  LMP 05/23/2014  Physical Exam  Nursing note and vitals reviewed. Constitutional: She is oriented to person, place, and time. Vital signs are normal. She appears well-developed and well-nourished.  Non-toxic appearance. She does not have a sickly appearance.  No distress.  Afebrile, nontoxic, no acute distress  HENT:  Head: Normocephalic and atraumatic.  Mouth/Throat: Mucous membranes are normal.  Eyes: Conjunctivae and EOM are normal.  Neck: Normal range of motion. Neck supple.  Cardiovascular: Normal rate.   Pulmonary/Chest: Effort normal. No respiratory distress.  Abdominal: Soft. Normal appearance and bowel sounds are normal. She exhibits no distension. There is tenderness in the suprapubic area. There is CVA tenderness (right sided). There is no rigidity, no rebound, no guarding and no tenderness at McBurney's point.  Soft, nondistended, positive bowel sounds throughout. Mild tenderness to palpation in suprapubic area with no rebound guarding or rigidity. Positive right-sided CVA tenderness.  Musculoskeletal: Normal range of motion.  Neurological: She is alert and oriented to person, place, and time.  Skin: Skin is warm, dry and intact. No rash noted.  Psychiatric: She has a normal mood and affect. Her behavior is normal.    ED Course  Procedures (including critical care time)  DIAGNOSTIC STUDIES: Oxygen Saturation is 100% on RA, normal by my interpretation.    COORDINATION OF CARE: 10:15 PM-Discussed treatment plan which includes IM antibiotics with pt at bedside and pt agreed to plan.  Labs Review Labs Reviewed  URINALYSIS, ROUTINE W REFLEX MICROSCOPIC - Abnormal; Notable for the following:    Color, Urine AMBER (*)    APPearance CLOUDY (*)    Hgb urine dipstick LARGE (*)    Protein, ur 100 (*)    Nitrite POSITIVE (*)    Leukocytes, UA LARGE (*)    All other components within normal limits  URINE MICROSCOPIC-ADD ON - Abnormal; Notable for the following:    Bacteria, UA MANY (*)    All other components within normal limits  URINE CULTURE  PREGNANCY, URINE    Imaging Review No results found.   EKG Interpretation None      MDM   Final diagnoses:  UTI (lower urinary tract infection)  Flank pain    27y/o female  presenting for UTI symptoms with right-sided flank pain. Afebrile nontoxic and well-appearing with stable vital signs. Urine showing positive nitrites positive leukocytes many bacteria and too numerous to count white blood cells strongly indicative of UTI. Urine pregnancy was negative. Suspect early pyelonephritis, although given the clinical appearance today and tolerating PO intake well, doubt need for further imaging or laboratory studies. Will give IM dose of Rocephin here today as well as a prescription for Keflex. Culture sent. Given pain medication and nausea control medication, although she denied wanting these during the ED stay. Tolerating by mouth fluids well. Stable for discharge. I explained the diagnosis and have given explicit precautions to return to the ER including for any other new or worsening symptoms. The patient understands and accepts the medical plan as it's been dictated and I have answered their questions. Discharge instructions concerning home care and prescriptions have been given. The patient is STABLE and is discharged to home in good condition.   I personally performed the services described in this documentation, which was scribed in my presence. The recorded information has been reviewed and is accurate.  BP 101/63  Pulse 70  Temp(Src) 98.6 F (37 C) (Oral)  Resp 18  Ht  (1.651 m)  Wt 132 lb (59.875 kg)  BMI 21.97 kg/m2  SpO2 99%  LMP 05/23/2014  Meds ordered this encounter  Medications  . cefTRIAXone (ROCEPHIN) injection 250 mg    Sig:     Order Specific Question:  Antibiotic Indication:    Answer:  UTI  . lidocaine (PF) (XYLOCAINE) 1 % injection 2 mL    Sig:   . lidocaine (PF) (XYLOCAINE) 1 % injection    Sig:     Ali Lowe   : cabinet override  . cephALEXin (KEFLEX) 500 MG capsule    Sig: 2 caps po bid x 7 days    Dispense:  28 capsule    Refill:  0    Order Specific Question:  Supervising Provider    Answer:  Eber Hong D [[id number]]  .  naproxen (NAPROSYN) 500 MG tablet    Sig: Take 1 tablet (500 mg total) by mouth 2 (two) times daily as needed for mild pain, moderate pain or headache (TAKE WITH MEALS.).    Dispense:  20 tablet    Refill:  0    Order Specific Question:  Supervising Provider    Answer:  Eber Hong D [[id number]]  . ondansetron (ZOFRAN ODT) 4 MG disintegrating tablet    Sig: Take 1 tablet (4 mg total) by mouth every 8 (eight) hours as needed for nausea or vomiting.    Dispense:  15 tablet    Refill:  0  Order Specific Question:  Supervising Provider    Answer:  Eber Hong D [[id number]]  . HYDROcodone-acetaminophen (NORCO) 5-325 MG per tablet    Sig: Take 1-2 tablets by mouth every 6 (six) hours as needed for severe pain.    Dispense:  10 tablet    Refill:  0    Order Specific Question:  Supervising Provider    Answer:  Vida Roller [address] Camprubi-Soms, PA-C 06/23/14 [phone]

## 2014-06-23 NOTE — Discharge Instructions (Signed)
Stay very well hydrated with plenty of water throughout the day. Take antibiotic until completed. Use naprosyn and norco as needed for pain, but don't drive while taking norco. Use zofran as needed for nausea. Follow up with primary care physician in 3-5 days for recheck of ongoing symptoms but return to ER for emergent changing or worsening of symptoms. Please seek immediate care if you develop the following: You develop back pain.  Your symptoms are no better, or worse in 3 days. There is severe back pain or lower abdominal pain.  You develop chills.  You have a fever.  There is nausea or vomiting.  There is continued burning or discomfort with urination.    Urinary Tract Infection Urinary tract infections (UTIs) can develop anywhere along your urinary tract. Your urinary tract is your body's drainage system for removing wastes and extra water. Your urinary tract includes two kidneys, two ureters, a bladder, and a urethra. Your kidneys are a pair of bean-shaped organs. Each kidney is about the size of your fist. They are located below your ribs, one on each side of your spine. CAUSES Infections are caused by microbes, which are microscopic organisms, including fungi, viruses, and bacteria. These organisms are so small that they can only be seen through a microscope. Bacteria are the microbes that most commonly cause UTIs. SYMPTOMS  Symptoms of UTIs may vary by age and gender of the patient and by the location of the infection. Symptoms in young women typically include a frequent and intense urge to urinate and a painful, burning feeling in the bladder or urethra during urination. Older women and men are more likely to be tired, shaky, and weak and have muscle aches and abdominal pain. A fever may mean the infection is in your kidneys. Other symptoms of a kidney infection include pain in your back or sides below the ribs, nausea, and vomiting. DIAGNOSIS To diagnose a UTI, your caregiver will ask  you about your symptoms. Your caregiver also will ask to provide a urine sample. The urine sample will be tested for bacteria and white blood cells. White blood cells are made by your body to help fight infection. TREATMENT  Typically, UTIs can be treated with medication. Because most UTIs are caused by a bacterial infection, they usually can be treated with the use of antibiotics. The choice of antibiotic and length of treatment depend on your symptoms and the type of bacteria causing your infection. HOME CARE INSTRUCTIONS  If you were prescribed antibiotics, take them exactly as your caregiver instructs you. Finish the medication even if you feel better after you have only taken some of the medication.  Drink enough water and fluids to keep your urine clear or pale yellow.  Avoid caffeine, tea, and carbonated beverages. They tend to irritate your bladder.  Empty your bladder often. Avoid holding urine for long periods of time.  Empty your bladder before and after sexual intercourse.  After a bowel movement, women should cleanse from front to back. Use each tissue only once. SEEK MEDICAL CARE IF:   You have back pain.  You develop a fever.  Your symptoms do not begin to resolve within 3 days. SEEK IMMEDIATE MEDICAL CARE IF:   You have severe back pain or lower abdominal pain.  You develop chills.  You have nausea or vomiting.  You have continued burning or discomfort with urination. MAKE SURE YOU:   Understand these instructions.  Will watch your condition.  Will get help right away  if you are not doing well or get worse. Document Released: 07/21/2005 Document Revised: 04/11/2012 Document Reviewed: 11/19/2011 [facility] Patient Information 2015 [place], [place]. This information is not intended to replace advice given to you by your health care provider. Make sure you discuss any questions you have with your health care provider.  Flank Pain Flank pain is pain in your side.  The flank is the area of your side between your upper belly (abdomen) and your back. Pain in this area can be caused by many different things. HOME CARE Home care and treatment will depend on the cause of your pain.  Rest as told by your doctor.  Drink enough fluids to keep your pee (urine) clear or pale yellow.  Only take medicine as told by your doctor.  Tell your doctor about any changes in your pain.  Follow up with your doctor. GET HELP RIGHT AWAY IF:   Your pain does not get better with medicine.   You have new symptoms or your symptoms get worse.  Your pain gets worse.   You have belly (abdominal) pain.   You are short of breath.   You always feel sick to your stomach (nauseous).   You keep throwing up (vomiting).   You have puffiness (swelling) in your belly.   You feel light-headed or you pass out (faint).   You have blood in your pee.  You have a fever or lasting symptoms for more than 2-3 days.  You have a fever and your symptoms suddenly get worse. MAKE SURE YOU:   Understand these instructions.  Will watch your condition.  Will get help right away if you are not doing well or get worse. Document Released: 07/20/2008 Document Revised: 02/25/2014 Document Reviewed: 05/25/2012 [facility] Patient Information 2015 [place], [place]. This information is not intended to replace advice given to you by your health care provider. Make sure you discuss any questions you have with your health care provider.

## 2014-06-23 NOTE — ED Provider Notes (Signed)
Medical screening examination/treatment/procedure(s) were performed by non-physician practitioner and as supervising physician I was immediately available for consultation/collaboration.   EKG Interpretation None        Saige Busby J. Ashyah Quizon, MD 06/23/14 [id number] 

## 2014-06-23 NOTE — ED Notes (Signed)
Pt. reports UTI symptoms : dysuria , malodorous urine , right lower back pain for several days , denies injury / no fever or chills.

## 2014-06-26 LAB — URINE CULTURE

## 2014-06-27 ENCOUNTER — Telehealth (HOSPITAL_BASED_OUTPATIENT_CLINIC_OR_DEPARTMENT_OTHER): Payer: Self-pay | Admitting: Emergency Medicine

## 2014-06-27 NOTE — Telephone Encounter (Signed)
Post ED Visit - Positive Culture Follow-up  Culture report reviewed by antimicrobial stewardship pharmacist:  Wes Dulaney, Pharm.D., BCPS  Celedonio Miyamoto, Pharm.D., BCPS  Georgina Pillion, Pharm.D., BCPS  [place], [place].D., BCPS, AAHIVP  Estella Husk, Pharm.D., BCPS, AAHIVP  Carly Sabat, Pharm.D.  Enzo Bi, [address].D.  Positive urine culture >100,000 colonies/ml E. Coli Treated with Cephalexin  po caps bid x 7 days , organism sensitive to the same and no further patient follow-up is required at this time.  Berle Mull 06/27/2014, 9:49 AM

## 2014-08-26 ENCOUNTER — Encounter (HOSPITAL_COMMUNITY): Payer: Self-pay | Admitting: Emergency Medicine

## 2014-10-07 ENCOUNTER — Ambulatory Visit (INDEPENDENT_AMBULATORY_CARE_PROVIDER_SITE_OTHER): Payer: Managed Care, Other (non HMO)

## 2014-10-07 ENCOUNTER — Ambulatory Visit (INDEPENDENT_AMBULATORY_CARE_PROVIDER_SITE_OTHER): Payer: Managed Care, Other (non HMO) | Admitting: Family Medicine

## 2014-10-07 ENCOUNTER — Encounter: Payer: Self-pay | Admitting: Family Medicine

## 2014-10-07 VITALS — BP 108/70 | HR 80 | Temp 98.0°F | Wt 140.0 lb

## 2014-10-07 DIAGNOSIS — Z23 Encounter for immunization: Secondary | ICD-10-CM

## 2014-10-07 DIAGNOSIS — J018 Other acute sinusitis: Secondary | ICD-10-CM

## 2014-10-07 MED ORDER — AZITHROMYCIN 250 MG PO TABS: ORAL_TABLET | ORAL | Status: AC

## 2014-10-07 MED ORDER — BENZONATATE 200 MG PO CAPS: 200.0000 mg | ORAL_CAPSULE | Freq: Two times a day (BID) | ORAL | Status: DC | PRN

## 2014-10-07 NOTE — Progress Notes (Signed)
Pre visit review using our clinic review tool, if applicable. No additional management support is needed unless otherwise documented below in the visit note. 

## 2014-10-07 NOTE — Progress Notes (Signed)
   Subjective:    Patient ID: Kristin Parks, female    DOB: 01/01/1986, 28 y.o.   MRN: [id number]  HPI   Patient seen with about one week history of increased sinus pressure headaches cough which is worse at night. She's had nasal congestion and greenish nasal and productive cough. Denies any fever. She has tried some over-the-counter medications with minimal relief. Still needs flu vaccine. Nonsmoker.  Past Medical History  Diagnosis Date  . ANEMIA-IRON DEFICIENCY 10/23/2009  . DEPRESSION 07/08/2010  . PANIC DISORDER 10/23/2009  . High risk HPV infection    Past Surgical History  Procedure Laterality Date  . Mouth surgery    . Wisdom tooth extraction    . Cesarean section  11/19/2011    Procedure: CESAREAN SECTION;  Surgeon: Serita KyleSheronette A Cousins, MD;  Location: [facility];  Service: Gynecology;  Laterality: N/A;  Primary    reports that she has never smoked. She has never used smokeless tobacco. She reports that she does not drink alcohol or use illicit drugs. family history includes Arthritis in her maternal grandfather, maternal grandmother, and mother; Hypertension in her maternal grandfather, maternal grandmother, paternal grandfather, and paternal grandmother. There is no history of Cancer. Allergies  Allergen Reactions  . Codeine Itching      Review of Systems  Constitutional: Negative for fever and chills.  HENT: Positive for congestion.   Respiratory: Positive for cough.   Neurological: Positive for headaches.       Objective:   Physical Exam  Constitutional: She appears well-developed and well-nourished.  HENT:  Right Ear: External ear normal.  Left Ear: External ear normal.  Mouth/Throat: Oropharynx is clear and moist.  Neck: Neck supple.  Cardiovascular: Normal rate and regular rhythm.   Pulmonary/Chest: Effort normal and breath sounds normal. No respiratory distress. She has no wheezes. She has no rales.          Assessment & Plan:  Acute sinusitis. We  explained these are usually viral. We've recommended symptomatic treatment. Tessalon 200 milligrams every 8 hours as needed for cough. Recommend no antibiotics at this point but if symptoms progress or persist after next week start Zithromax

## 2014-10-07 NOTE — Patient Instructions (Signed)

## 2015-01-15 ENCOUNTER — Ambulatory Visit (INDEPENDENT_AMBULATORY_CARE_PROVIDER_SITE_OTHER): Payer: Managed Care, Other (non HMO) | Admitting: Nurse Practitioner

## 2015-01-15 ENCOUNTER — Encounter: Payer: Self-pay | Admitting: Nurse Practitioner

## 2015-01-15 ENCOUNTER — Ambulatory Visit (HOSPITAL_COMMUNITY)
Admission: RE | Admit: 2015-01-15 | Discharge: 2015-01-15 | Disposition: A | Discharge status: Home / Self Care | Payer: Managed Care, Other (non HMO) | Source: Ambulatory Visit | Attending: Nurse Practitioner | Admitting: Nurse Practitioner

## 2015-01-15 VITALS — BP 108/72 | HR 76 | Temp 98.1°F | Ht 65.0 in | Wt 144.0 lb

## 2015-01-15 DIAGNOSIS — R109 Unspecified abdominal pain: Secondary | ICD-10-CM

## 2015-01-15 LAB — POCT URINALYSIS DIPSTICK
Bilirubin, UA: NEGATIVE
Blood, UA: NEGATIVE
Glucose, UA: NEGATIVE
KETONES UA: NEGATIVE
LEUKOCYTES UA: NEGATIVE
Nitrite, UA: NEGATIVE
PH UA: 6
PROTEIN UA: NEGATIVE
Spec Grav, UA: 1.01
Urobilinogen, UA: 0.2

## 2015-01-15 LAB — URINALYSIS, MICROSCOPIC ONLY

## 2015-01-15 NOTE — Progress Notes (Signed)
Pre visit review using our clinic review tool, if applicable. No additional management support is needed unless otherwise documented below in the visit note. 

## 2015-01-15 NOTE — Progress Notes (Signed)
Subjective:     Kristin Parks is a 29 y.o. female who presents for evaluation of abdominal pain. Onset was today. Symptoms have been unchanged. The pain is described as aching, and is 5/10 in intensity. Pain is located in the RUQ and costovertebral angle on the right without radiation.  Aggravating factors: none-took protonix without relief.  Alleviating factors: leaning forward. Associated symptoms: anorexia and constipation. The patient denies fever and nausea, dysuria. No Hx kidney stones. Last MC 1 week ago.  The patient's history has been marked as reviewed and updated as appropriate.  Review of Systems Pertinent items are noted in HPI.     Objective:    BP 108/72 mmHg  Pulse 76  Temp(Src) 98.1 F (36.7 C) (Oral)  Ht 5\' 5"  (1.651 m)  Wt 144 lb (65.318 kg)  BMI 23.96 kg/m2  SpO2 97%  LMP 01/10/2015 General appearance: alert, cooperative, appears stated age and no distress Head: Normocephalic, without obvious abnormality, atraumatic Eyes: negative findings: lids and lashes normal and conjunctivae and sclerae normal Lungs: clear to auscultation bilaterally Heart: regular rate and rhythm, S1, S2 normal, no murmur, click, rub or gallop Abdomen: normal findings: no masses palpable, no organomegaly and spleen non-palpable and abnormal findings:  epigastric tenderness Neurologic: Grossly normal   MSK: R CVA tenderness   Assessment:Plan     1. Right flank pain DD: UTI, kidney stone, constipation, trapped gas, mittelschmerz - US Renal; Future - POCT urinalysis dipstick-nml - Urine culture - Urine Microscopic Only  See pt instructions for pain & constipation F/u Prn results

## 2015-01-15 NOTE — Patient Instructions (Signed)
Take ibuprophen for pain: 600 mg with food, twice daily if needed.   Please get ultrasound. We will call with results.  If you have urinary track infection, we will call & start antibiotic.  Start miralax for constipation: Take 1 capful with 8 oz water every hour up to 5 doses in 24 hours until you have bowel movement.

## 2015-01-16 ENCOUNTER — Telehealth: Payer: Self-pay | Admitting: Nurse Practitioner

## 2015-01-16 LAB — URINE CULTURE
COLONY COUNT: NO GROWTH
ORGANISM ID, BACTERIA: NO GROWTH

## 2015-01-16 NOTE — Telephone Encounter (Signed)
pls call pt: Advise US does not show kidney stone. Pain may be r/t ovulation-women can ovulation can occur as early as 7 days after 1st day of cycle. Ibuprophen is often effective for this type of pain. Ask how she is feeling.

## 2015-01-16 NOTE — Telephone Encounter (Signed)
Called and informed patient. Patient said she is feeling better today!

## 2015-03-25 ENCOUNTER — Telehealth: Payer: Self-pay | Admitting: Family Medicine

## 2015-03-25 MED ORDER — PANTOPRAZOLE SODIUM 40 MG PO TBEC: DELAYED_RELEASE_TABLET | ORAL | Status: DC

## 2015-03-25 NOTE — Telephone Encounter (Signed)
Pt request refill  pantoprazole (PROTONIX) 40 MG tablet  [facility] Pt advised she needed fup appt, scheduled pt 6/15. Can you send in 30 days to get her through?

## 2015-03-25 NOTE — Telephone Encounter (Signed)
Rx sent to pharmacy   

## 2015-04-09 ENCOUNTER — Ambulatory Visit (INDEPENDENT_AMBULATORY_CARE_PROVIDER_SITE_OTHER): Payer: Managed Care, Other (non HMO) | Admitting: Family Medicine

## 2015-04-09 ENCOUNTER — Encounter: Payer: Self-pay | Admitting: Family Medicine

## 2015-04-09 VITALS — BP 118/70 | HR 61 | Temp 98.1°F | Wt 143.0 lb

## 2015-04-09 DIAGNOSIS — R1310 Dysphagia, unspecified: Secondary | ICD-10-CM | POA: Diagnosis not present

## 2015-04-09 DIAGNOSIS — K219 Gastro-esophageal reflux disease without esophagitis: Secondary | ICD-10-CM

## 2015-04-09 MED ORDER — PANTOPRAZOLE SODIUM 40 MG PO TBEC: DELAYED_RELEASE_TABLET | ORAL | Status: DC

## 2015-04-09 NOTE — Progress Notes (Signed)
Pre visit review using our clinic review tool, if applicable. No additional management support is needed unless otherwise documented below in the visit note. 

## 2015-04-09 NOTE — Progress Notes (Signed)
   Subjective:    Patient ID: Kristin Parks, female    DOB: 07/02/1986, 29 y.o.   MRN: [id number]  HPI Patient seen for follow-up regarding her chronic GERD. She's had several years of GERD symptoms which have generally been controlled with medication. Currently takes Protonix 40 mg once daily. She is having more frequent breakthrough symptoms is taking almost daily Pepcid but still has some substernal burning. She has occasional solid food dysphagia. Her appetite is good. She has lost just a few pounds recently which she treated her own efforts. She has greatly scaled back caffeine. Rare alcohol use. No stool changes. No recent nausea or vomiting. Nonsmoker  Past Medical History  Diagnosis Date  . ANEMIA-IRON DEFICIENCY 10/23/2009  . DEPRESSION 07/08/2010  . PANIC DISORDER 10/23/2009  . High risk HPV infection    Past Surgical History  Procedure Laterality Date  . Mouth surgery    . Wisdom tooth extraction    . Cesarean section  11/19/2011    Procedure: CESAREAN SECTION;  Surgeon: Serita Kyle, MD;  Location: [facility];  Service: Gynecology;  Laterality: N/A;  Primary    reports that she has never smoked. She has never used smokeless tobacco. She reports that she does not drink alcohol or use illicit drugs. family history includes Arthritis in her maternal grandfather, maternal grandmother, and mother; Hypertension in her maternal grandfather, maternal grandmother, paternal grandfather, and paternal grandmother. There is no history of Cancer. Allergies  Allergen Reactions  . Codeine Itching      Review of Systems  Constitutional: Negative for appetite change and unexpected weight change.  Respiratory: Negative for shortness of breath.   Cardiovascular: Negative for chest pain.  Gastrointestinal: Negative for nausea, vomiting and blood in stool.       Objective:   Physical Exam  Constitutional: She appears well-developed and well-nourished.  Cardiovascular: Normal rate and  regular rhythm.   Pulmonary/Chest: Effort normal and breath sounds normal. No respiratory distress. She has no wheezes. She has no rales.  Abdominal: Soft. Bowel sounds are normal. She exhibits no distension and no mass. There is no tenderness. There is no rebound and no guarding.          Assessment & Plan:  Long-standing history of GERD with recurrent occasional solid food dysphagia. She is currently on Protonix and supplements with Pepcid but still has some breakthrough symptoms. Set up GI referral to further assess. Dietary factors are again discussed. We have suggested elevating head of bed 6-8 inches.

## 2015-04-10 ENCOUNTER — Other Ambulatory Visit: Payer: Self-pay | Admitting: Family Medicine

## 2015-04-11 ENCOUNTER — Encounter: Payer: Self-pay | Admitting: Gastroenterology

## 2015-05-27 ENCOUNTER — Ambulatory Visit (INDEPENDENT_AMBULATORY_CARE_PROVIDER_SITE_OTHER): Payer: Managed Care, Other (non HMO) | Admitting: Family Medicine

## 2015-05-27 ENCOUNTER — Encounter: Payer: Self-pay | Admitting: Family Medicine

## 2015-05-27 VITALS — BP 118/80 | HR 71 | Temp 98.0°F | Wt 142.0 lb

## 2015-05-27 DIAGNOSIS — R197 Diarrhea, unspecified: Secondary | ICD-10-CM

## 2015-05-27 DIAGNOSIS — R252 Cramp and spasm: Secondary | ICD-10-CM

## 2015-05-27 LAB — BASIC METABOLIC PANEL WITH GFR
BUN: 8 mg/dL (ref 6–23)
CO2: 27 meq/L (ref 19–32)
Calcium: 9.4 mg/dL (ref 8.4–10.5)
Chloride: 103 meq/L (ref 96–112)
Creatinine, Ser: 0.85 mg/dL (ref 0.40–1.20)
GFR: 83.51 mL/min
Glucose, Bld: 94 mg/dL (ref 70–99)
Potassium: 4 meq/L (ref 3.5–5.1)
Sodium: 138 meq/L (ref 135–145)

## 2015-05-27 LAB — TSH: TSH: 1.37 u[IU]/mL (ref 0.35–4.50)

## 2015-05-27 LAB — MAGNESIUM: Magnesium: 1.9 mg/dL (ref 1.5–2.5)

## 2015-05-27 NOTE — Progress Notes (Signed)
Pre visit review using our clinic review tool, if applicable. No additional management support is needed unless otherwise documented below in the visit note. 

## 2015-05-27 NOTE — Progress Notes (Signed)
   Subjective:    Patient ID: Kristin Parks, female    DOB: Aug 09, 1986, 29 y.o.   MRN: [id number]  HPI  Acute visit. Patient seen with nonspecific fatigue for several weeks and 2 day hx of diarrhea. She had constipation with no bowel movement for several days and started taking over-the-counter laxative with bisacodyl 5 mg and after the third dose developed diarrhea yesterday which has persisted into today. She describes watery diarrhea nonbloody. No associated fevers or chills. No recent travels or antibiotic use. She has been having some more chronic epigastric pains which are  unchanged and has follow-up with gastroenterology soon.  She relates significant muscle cramps in her legs and feet over the past several weeks. She admits that she may not be drinking enough fluids. She has not had any history of chronic diarrhea  Past Medical History  Diagnosis Date  . ANEMIA-IRON DEFICIENCY 10/23/2009  . DEPRESSION 07/08/2010  . PANIC DISORDER 10/23/2009  . High risk HPV infection    Past Surgical History  Procedure Laterality Date  . Mouth surgery    . Wisdom tooth extraction    . Cesarean section  11/19/2011    Procedure: CESAREAN SECTION;  Surgeon: Serita Kyle, MD;  Location: [facility];  Service: Gynecology;  Laterality: N/A;  Primary    reports that she has never smoked. She has never used smokeless tobacco. She reports that she does not drink alcohol or use illicit drugs. family history includes Arthritis in her maternal grandfather, maternal grandmother, and mother; Hypertension in her maternal grandfather, maternal grandmother, paternal grandfather, and paternal grandmother. There is no history of Cancer. Allergies  Allergen Reactions  . Codeine Itching     Review of Systems  Constitutional: Positive for fatigue.  Respiratory: Negative for shortness of breath.   Cardiovascular: Negative for chest pain.  Gastrointestinal: Positive for abdominal pain and diarrhea. Negative for  blood in stool.  Genitourinary: Negative for dysuria.  Neurological: Negative for dizziness and syncope.       Objective:   Physical Exam  Constitutional: She appears well-developed and well-nourished.  HENT:  Mouth/Throat: Oropharynx is clear and moist.  Cardiovascular: Normal rate and regular rhythm.   Pulmonary/Chest: Effort normal and breath sounds normal. No respiratory distress. She has no wheezes. She has no rales.  Abdominal: Soft. Bowel sounds are normal. She exhibits no distension and no mass. There is tenderness (Mild epigastric tenderness). There is no rebound and no guarding.  Neurological: She is alert.          Assessment & Plan:  #1 Diarrhea. This follows recent stimulant laxative use and suspect this is the cause for diarrhea. She had a relatively normal bowel movement after the first use but then developed diarrhea after subsequent use. Leave off any further stimulant laxative. Increase fluid intake. For future episodes of constipation consider MiraLAX #2 muscle cramps. These have been for several weeks. Increase hydration. Check basic metabolic panel and magnesium level

## 2015-05-27 NOTE — Patient Instructions (Signed)

## 2015-06-17 ENCOUNTER — Ambulatory Visit: Payer: Managed Care, Other (non HMO) | Admitting: Gastroenterology

## 2015-06-17 ENCOUNTER — Telehealth: Payer: Self-pay | Admitting: Gastroenterology

## 2015-06-17 NOTE — Telephone Encounter (Signed)
Yes, charge. 

## 2015-08-20 ENCOUNTER — Encounter: Payer: Self-pay | Admitting: Gastroenterology

## 2015-08-20 ENCOUNTER — Ambulatory Visit (INDEPENDENT_AMBULATORY_CARE_PROVIDER_SITE_OTHER): Payer: Managed Care, Other (non HMO) | Admitting: Gastroenterology

## 2015-08-20 VITALS — BP 106/72 | HR 72 | Ht 65.0 in | Wt 139.0 lb

## 2015-08-20 DIAGNOSIS — K219 Gastro-esophageal reflux disease without esophagitis: Secondary | ICD-10-CM | POA: Diagnosis not present

## 2015-08-20 MED ORDER — PANTOPRAZOLE SODIUM 40 MG PO TBEC: DELAYED_RELEASE_TABLET | ORAL | Status: DC

## 2015-08-20 NOTE — Patient Instructions (Signed)
We have sent the following medications to your pharmacy for you to pick up at your convenience:Protonix 40 mg twice daily before breakfast and dinner.   You have been scheduled for an endoscopy. Please follow written instructions given to you at your visit today. If you use inhalers (even only as needed), please bring them with you on the day of your procedure.  Patient advised to avoid spicy, acidic, citrus, chocolate, mints, fruit and fruit juices.  Limit the intake of caffeine, alcohol and Soda.  Don't exercise too soon after eating.  Don't lie down within 3-4 hours of eating.  Elevate the head of your bed.  Thank you for choosing me and [facility].  Venita LickMalcolm T. Pleas KochStark, Jr., MD., Clementeen GrahamFACG  cc: Evelena PeatBruce Burchette, MD

## 2015-08-20 NOTE — Progress Notes (Signed)
    History of Present Illness: This is a  29 year old female who relates an 8 year history of reflux problems. The past few years she's been maintained on pantoprazole 40 mg daily. She has frequent nocturnal symptoms with regurgitation and heartburn. She has occasional episodes of painful swallowing. She does not relate solid or liquid dysphagia at this time.  She has chronic problems with mild constipation the symptoms have not changed recently. Denies weight loss, abdominal pain, diarrhea, change in stool caliber, melena, hematochezia, nausea, vomiting, dysphagia, chest pain.  Current Medications, Allergies, Past Medical History, Past Surgical History, Family History and Social History were reviewed in [facility] electronic medical record.  Physical Exam: General: Well developed, well nourished, no acute distress Head: Normocephalic and atraumatic Eyes:  sclerae anicteric, EOMI Ears: Normal auditory acuity Mouth: No deformity or lesions Lungs: Clear throughout to auscultation Heart: Regular rate and rhythm; no murmurs, rubs or bruits Abdomen: Soft, non tender and non distended. No masses, hepatosplenomegaly or hernias noted. Normal Bowel sounds Musculoskeletal: Symmetrical with no gross deformities  Pulses:  Normal pulses noted Extremities: No clubbing, cyanosis, edema or deformities noted Neurological: Alert oriented x 4, grossly nonfocal Psychological:  Alert and cooperative. Normal mood and affect  Assessment and Recommendations:  1. GERD with occasional odynophagia. Frequent nocturnal symptoms.  Intensify all antireflux measures. Begin the use of 4 inch bed blocks. Increase pantoprazole to 40 mg twice daily taken before breakfast and dinner. Use TUMS when necessary. Schedule EGD.  Rule out esophagitis, strictures, Barrett's. The risks (including bleeding, perforation, infection, missed lesions, medication reactions and possible hospitalization or surgery if complications occur),  benefits, and alternatives to endoscopy with possible biopsy and possible dilation were discussed with the patient and they consent to proceed.   2.  Mild constipation. Increase dietary fiber and water intake. MiraLAX daily as needed.

## 2015-08-26 ENCOUNTER — Ambulatory Visit (AMBULATORY_SURGERY_CENTER): Payer: Managed Care, Other (non HMO) | Admitting: Gastroenterology

## 2015-08-26 ENCOUNTER — Encounter: Payer: Self-pay | Admitting: Gastroenterology

## 2015-08-26 VITALS — BP 119/62 | HR 62 | Temp 98.1°F | Resp 21 | Ht 65.0 in | Wt 139.0 lb

## 2015-08-26 DIAGNOSIS — K2960 Other gastritis without bleeding: Secondary | ICD-10-CM | POA: Diagnosis not present

## 2015-08-26 DIAGNOSIS — R1310 Dysphagia, unspecified: Secondary | ICD-10-CM

## 2015-08-26 DIAGNOSIS — K2100 Gastro-esophageal reflux disease with esophagitis, without bleeding: Secondary | ICD-10-CM

## 2015-08-26 MED ORDER — SODIUM CHLORIDE 0.9 % IV SOLN: 500.0000 mL | INTRAVENOUS | Status: DC

## 2015-08-26 NOTE — Progress Notes (Signed)
Report to PACU, RN, vss, BBS= Clear.  

## 2015-08-26 NOTE — Progress Notes (Signed)
Called to room to assist during endoscopic procedure.  Patient ID and intended procedure confirmed with present staff. Received instructions for my participation in the procedure from the performing physician.  

## 2015-08-26 NOTE — Op Note (Signed)
[facility] [address] [place], [id number]   ENDOSCOPY PROCEDURE REPORT  PATIENT: Kristin CleverBracey, [name]  MR#: [id number] BIRTHDATE: Nov 04, 1985 , 29  yrs. old GENDER: female ENDOSCOPIST: Meryl DareMalcolm T Angelyne Terwilliger, MD, Clementeen GrahamFACG REFERRED BY:  Evelena PeatBruce Burchette, M.D. PROCEDURE DATE:  08/26/2015 PROCEDURE:  EGD w/ biopsy ASA CLASS:     Class II INDICATIONS:  history of esophageal reflux and odynophagia. MEDICATIONS: Monitored anesthesia care, Propofol 200 mg IV, and lidocaine 120 mg IV TOPICAL ANESTHETIC: none DESCRIPTION OF PROCEDURE: After the risks benefits and alternatives of the procedure were thoroughly explained, informed consent was obtained.  The LB [id number] endoscope was introduced through the mouth and advanced to the second portion of the duodenum , Without limitations.  The instrument was slowly withdrawn as the mucosa was fully examined.     STOMACH: Gastritis, mild, nonerosive, patchy was found in the antrum and body.  Multiple biopsies were performed.   The stomach otherwise appeared normal. ESOPHAGUS: The mucosa of the esophagus appeared normal. DUODENUM: The duodenal mucosa showed no abnormalities in the bulb and 2nd part of the duodenum.  Retroflexed views revealed no abnormalities.     The scope was then withdrawn from the patient and the procedure completed.  COMPLICATIONS: There were no immediate complications.  ENDOSCOPIC IMPRESSION: 1.   Mild gastritis n the antrum and body; multiple biopsies performed 2.   The EGD otherwise appeared normal  RECOMMENDATIONS: 1.  Anti-reflux regimen long term 2.  Await pathology results 3.  Continue PPI bid 4.  Office appt in 3 months  eSigned:  Meryl DareMalcolm T Tayra Dawe, MD, [facility] Hospital[facility]ACG 08/26/2015 10:39 AM

## 2015-08-26 NOTE — Patient Instructions (Signed)
YOU HAD AN ENDOSCOPIC PROCEDURE TODAY AT THE [facility]:   Refer to the procedure report that was given to you for any specific questions about what was found during the examination.  If the procedure report does not answer your questions, please call your gastroenterologist to clarify.  If you requested that your care partner not be given the details of your procedure findings, then the procedure report has been included in a sealed envelope for you to review at your convenience later.  YOU SHOULD EXPECT: Some feelings of bloating in the abdomen. Passage of more gas than usual.  Walking can help get rid of the air that was put into your GI tract during the procedure and reduce the bloating. If you had a lower endoscopy (such as a colonoscopy or flexible sigmoidoscopy) you may notice spotting of blood in your stool or on the toilet paper. If you underwent a bowel prep for your procedure, you may not have a normal bowel movement for a few days.  Please Note:  You might notice some irritation and congestion in your nose or some drainage.  This is from the oxygen used during your procedure.  There is no need for concern and it should clear up in a day or so.  SYMPTOMS TO REPORT IMMEDIATELY:    Following upper endoscopy (EGD)  Vomiting of blood or coffee ground material  New chest pain or pain under the shoulder blades  Painful or persistently difficult swallowing  New shortness of breath  Fever of 100F or higher  Black, tarry-looking stools  For urgent or emergent issues, a gastroenterologist can be reached at any hour by calling [phone].   DIET: Your first meal following the procedure should be a small meal and then it is ok to progress to your normal diet. Heavy or fried foods are harder to digest and may make you feel nauseous or bloated.  Likewise, meals heavy in dairy and vegetables can increase bloating.  Drink plenty of fluids but you should avoid alcoholic beverages  for 24 hours.  ACTIVITY:  You should plan to take it easy for the rest of today and you should NOT DRIVE or use heavy machinery until tomorrow (because of the sedation medicines used during the test).    FOLLOW UP: Our staff will call the number listed on your records the next business day following your procedure to check on you and address any questions or concerns that you may have regarding the information given to you following your procedure. If we do not reach you, we will leave a message.  However, if you are feeling well and you are not experiencing any problems, there is no need to return our call.  We will assume that you have returned to your regular daily activities without incident.  If any biopsies were taken you will be contacted by phone or by letter within the next 1-3 weeks.  Please call us at [phone] if you have not heard about the biopsies in 3 weeks.    SIGNATURES/CONFIDENTIALITY: You and/or your care partner have signed paperwork which will be entered into your electronic medical record.  These signatures attest to the fact that that the information above on your After Visit Summary has been reviewed and is understood.  Full responsibility of the confidentiality of this discharge information lies with you and/or your care-partner. 

## 2015-08-27 ENCOUNTER — Telehealth: Payer: Self-pay

## 2015-08-27 NOTE — Telephone Encounter (Signed)
No answer, left voicemail

## 2015-09-01 ENCOUNTER — Encounter: Payer: Self-pay | Admitting: Gastroenterology

## 2016-01-16 ENCOUNTER — Other Ambulatory Visit: Payer: Self-pay | Admitting: Family Medicine

## 2016-09-03 ENCOUNTER — Other Ambulatory Visit: Payer: Self-pay | Admitting: Gastroenterology

## 2016-10-04 ENCOUNTER — Other Ambulatory Visit: Payer: Self-pay | Admitting: Family Medicine

## 2016-10-12 ENCOUNTER — Other Ambulatory Visit: Payer: Self-pay | Admitting: Gastroenterology

## 2016-10-20 ENCOUNTER — Other Ambulatory Visit: Payer: Self-pay | Admitting: Gastroenterology

## 2016-10-31 ENCOUNTER — Other Ambulatory Visit: Payer: Self-pay | Admitting: Gastroenterology

## 2016-12-29 ENCOUNTER — Ambulatory Visit (INDEPENDENT_AMBULATORY_CARE_PROVIDER_SITE_OTHER): Payer: Managed Care, Other (non HMO) | Admitting: Family Medicine

## 2016-12-29 ENCOUNTER — Encounter: Payer: Self-pay | Admitting: Family Medicine

## 2016-12-29 VITALS — BP 110/74 | HR 74 | Temp 98.8°F | Wt 132.8 lb

## 2016-12-29 DIAGNOSIS — K219 Gastro-esophageal reflux disease without esophagitis: Secondary | ICD-10-CM

## 2016-12-29 MED ORDER — PANTOPRAZOLE SODIUM 40 MG PO TBEC: 40.0000 mg | DELAYED_RELEASE_TABLET | Freq: Every day | ORAL | 3 refills | Status: DC

## 2016-12-29 NOTE — Progress Notes (Signed)
Pre visit review using our clinic review tool, if applicable. No additional management support is needed unless otherwise documented below in the visit note. 

## 2016-12-29 NOTE — Patient Instructions (Signed)
Food Choices for Gastroesophageal Reflux Disease, Adult When you have gastroesophageal reflux disease (GERD), the foods you eat and your eating habits are very important. Choosing the right foods can help ease the discomfort of GERD. Consider working with a diet and nutrition specialist (dietitian) to help you make healthy food choices. What general guidelines should I follow? Eating plan  Choose healthy foods low in fat, such as fruits, vegetables, whole grains, low-fat dairy products, and lean meat, fish, and poultry.  Eat frequent, small meals instead of three large meals each day. Eat your meals slowly, in a relaxed setting. Avoid bending over or lying down until 2-3 hours after eating.  Limit high-fat foods such as fatty meats or fried foods.  Limit your intake of oils, butter, and shortening to less than 8 teaspoons each day.  Avoid the following: ? Foods that cause symptoms. These may be different for different people. Keep a food diary to keep track of foods that cause symptoms. ? Alcohol. ? Drinking large amounts of liquid with meals. ? Eating meals during the 2-3 hours before bed.  Cook foods using methods other than frying. This may include baking, grilling, or broiling. Lifestyle   Maintain a healthy weight. Ask your health care provider what weight is healthy for you. If you need to lose weight, work with your health care provider to do so safely.  Exercise for at least 30 minutes on 5 or more days each week, or as told by your health care provider.  Avoid wearing clothes that fit tightly around your waist and chest.  Do not use any products that contain nicotine or tobacco, such as cigarettes and e-cigarettes. If you need help quitting, ask your health care provider.  Sleep with the head of your bed raised. Use a wedge under the mattress or blocks under the bed frame to raise the head of the bed. What foods are not recommended? The items listed may not be a complete  list. Talk with your dietitian about what dietary choices are best for you. Grains Pastries or quick breads with added fat. French toast. Vegetables Deep fried vegetables. French fries. Any vegetables prepared with added fat. Any vegetables that cause symptoms. For some people this may include tomatoes and tomato products, chili peppers, onions and garlic, and horseradish. Fruits Any fruits prepared with added fat. Any fruits that cause symptoms. For some people this may include citrus fruits, such as oranges, grapefruit, pineapple, and lemons. Meats and other protein foods High-fat meats, such as fatty beef or pork, hot dogs, ribs, ham, sausage, salami and bacon. Fried meat or protein, including fried fish and fried chicken. Nuts and nut butters. Dairy Whole milk and chocolate milk. Sour cream. Cream. Ice cream. Cream cheese. Milk shakes. Beverages Coffee and tea, with or without caffeine. Carbonated beverages. Sodas. Energy drinks. Fruit juice made with acidic fruits (such as orange or grapefruit). Tomato juice. Alcoholic drinks. Fats and oils Butter. Margarine. Shortening. Ghee. Sweets and desserts Chocolate and cocoa. Donuts. Seasoning and other foods Pepper. Peppermint and spearmint. Any condiments, herbs, or seasonings that cause symptoms. For some people, this may include curry, hot sauce, or vinegar-based salad dressings. Summary  When you have gastroesophageal reflux disease (GERD), food and lifestyle choices are very important to help ease the discomfort of GERD.  Eat frequent, small meals instead of three large meals each day. Eat your meals slowly, in a relaxed setting. Avoid bending over or lying down until 2-3 hours after eating.  Limit high-fat   foods such as fatty meat or fried foods. This information is not intended to replace advice given to you by your health care provider. Make sure you discuss any questions you have with your health care provider. Document Released:  10/11/2005 Document Revised: 10/12/2016 Document Reviewed: 10/12/2016 Elsevier Interactive Patient Education  2017 [employer]vier Inc[employer]  

## 2016-12-29 NOTE — Progress Notes (Signed)
Subjective:     Patient ID: Kristin Parks, female   DOB: 10/01/1986, 31 y.o.   MRN: [id number]  HPI Patient is here requesting medication refill. She has history of panic disorder controlled on sertraline. She is requesting refills of Protonix 40 mg daily. She's tried several times to taper off without success. She's not had any dysphagia. She's lost some weight this year due to her efforts and feels good overall. Nonsmoker. Minimal caffeine use.  Past Medical History:  Diagnosis Date  . ANEMIA-IRON DEFICIENCY 10/23/2009  . DEPRESSION 07/08/2010  . High risk HPV infection   . PANIC DISORDER 10/23/2009   Past Surgical History:  Procedure Laterality Date  . CESAREAN SECTION  11/19/2011   Procedure: CESAREAN SECTION;  Surgeon: Serita KyleSheronette A Cousins, MD;  Location: [facility];  Service: Gynecology;  Laterality: N/A;  Primary  . MOUTH SURGERY    . WISDOM TOOTH EXTRACTION      reports that she has never smoked. She has never used smokeless tobacco. She reports that she drinks alcohol. She reports that she does not use drugs. family history includes Arthritis in her maternal grandfather, maternal grandmother, and mother; Colon cancer in her maternal grandfather; Hypertension in her maternal grandfather, maternal grandmother, paternal grandfather, and paternal grandmother. Allergies  Allergen Reactions  . Codeine Itching     Review of Systems  Constitutional: Negative for appetite change and unexpected weight change.  Respiratory: Negative for shortness of breath.   Cardiovascular: Negative for chest pain.       Objective:   Physical Exam  Constitutional: She appears well-developed and well-nourished.  Cardiovascular: Normal rate and regular rhythm.   Pulmonary/Chest: Effort normal and breath sounds normal. No respiratory distress. She has no wheezes. She has no rales.       Assessment:     GERD which is well-controlled on Protonix    Plan:     -refill medication for one year -We  suggested that she consider possibly try to taper back to every other day and supplement with H2 blocker as needed and eventually taper off if possible  Kristian CoveyBruce W Elmire Amrein MD [facility]

## 2017-07-11 ENCOUNTER — Other Ambulatory Visit: Payer: Self-pay | Admitting: Family Medicine

## 2017-07-18 ENCOUNTER — Ambulatory Visit (INDEPENDENT_AMBULATORY_CARE_PROVIDER_SITE_OTHER): Payer: Managed Care, Other (non HMO) | Admitting: Family Medicine

## 2017-07-18 ENCOUNTER — Encounter: Payer: Self-pay | Admitting: Family Medicine

## 2017-07-18 VITALS — BP 102/70 | HR 73 | Temp 98.3°F | Ht 65.0 in | Wt 137.0 lb

## 2017-07-18 DIAGNOSIS — Z23 Encounter for immunization: Secondary | ICD-10-CM | POA: Diagnosis not present

## 2017-07-18 DIAGNOSIS — Z0000 Encounter for general adult medical examination without abnormal findings: Secondary | ICD-10-CM | POA: Diagnosis not present

## 2017-07-18 LAB — CBC WITH DIFFERENTIAL/PLATELET
BASOS ABS: 0 10*3/uL (ref 0.0–0.1)
Basophils Relative: 0.6 % (ref 0.0–3.0)
EOS ABS: 0 10*3/uL (ref 0.0–0.7)
EOS PCT: 0.7 % (ref 0.0–5.0)
HEMATOCRIT: 37.2 % (ref 36.0–46.0)
HEMOGLOBIN: 12.4 g/dL (ref 12.0–15.0)
Lymphocytes Relative: 20.9 % (ref 12.0–46.0)
Lymphs Abs: 1.4 10*3/uL (ref 0.7–4.0)
MCHC: 33.4 g/dL (ref 30.0–36.0)
MCV: 91.4 fl (ref 78.0–100.0)
MONO ABS: 0.3 10*3/uL (ref 0.1–1.0)
Monocytes Relative: 4 % (ref 3.0–12.0)
Neutro Abs: 5 10*3/uL (ref 1.4–7.7)
Neutrophils Relative %: 73.8 % (ref 43.0–77.0)
Platelets: 371 10*3/uL (ref 150.0–400.0)
RBC: 4.07 Mil/uL (ref 3.87–5.11)
RDW: 12.7 % (ref 11.5–15.5)
WBC: 6.7 10*3/uL (ref 4.0–10.5)

## 2017-07-18 LAB — HEPATIC FUNCTION PANEL
ALT: 8 U/L (ref 0–35)
AST: 14 U/L (ref 0–37)
Albumin: 4.2 g/dL (ref 3.5–5.2)
Alkaline Phosphatase: 30 U/L — ABNORMAL LOW (ref 39–117)
BILIRUBIN DIRECT: 0.2 mg/dL (ref 0.0–0.3)
BILIRUBIN TOTAL: 1.2 mg/dL (ref 0.2–1.2)
Total Protein: 7 g/dL (ref 6.0–8.3)

## 2017-07-18 LAB — BASIC METABOLIC PANEL
BUN: 11 mg/dL (ref 6–23)
CO2: 26 mEq/L (ref 19–32)
Calcium: 9.6 mg/dL (ref 8.4–10.5)
Chloride: 103 mEq/L (ref 96–112)
Creatinine, Ser: 0.79 mg/dL (ref 0.40–1.20)
GFR: 89.61 mL/min (ref >60.00)
Glucose, Bld: 85 mg/dL (ref 70–99)
POTASSIUM: 4.1 meq/L (ref 3.5–5.1)
SODIUM: 139 meq/L (ref 135–145)

## 2017-07-18 LAB — LIPID PANEL
CHOLESTEROL: 154 mg/dL (ref 0–200)
HDL: 77.7 mg/dL (ref >39.00)
LDL Cholesterol: 53 mg/dL (ref 0–99)
NonHDL: 76.39
Total CHOL/HDL Ratio: 2
Triglycerides: 115 mg/dL (ref 0.0–149.0)
VLDL: 23 mg/dL (ref 0.0–40.0)

## 2017-07-18 LAB — TSH: TSH: 1.68 u[IU]/mL (ref 0.35–4.50)

## 2017-07-18 NOTE — Progress Notes (Signed)
Subjective:     Patient ID: Kristin Parks, female   DOB: [birth year], 31 y.o.   MRN: [id number]  HPI  Patient is seen for physical exam. She sees gynecologist yearly. Generally very healthy. She does have history of reflux and has been for at least couple years now on Protonix 40 mg daily. That seems to be controlling her symptoms well. She has history of panic disorder which is stable on sertraline. She's not tried tapering off Protonix recently. No dysphagia. Nonsmoker. No regular alcohol use.  She works full time and is also busy raising her 66-year-old daughter  Past Medical History:  Diagnosis Date  . ANEMIA-IRON DEFICIENCY 10/23/2009  . DEPRESSION 07/08/2010  . High risk HPV infection   . PANIC DISORDER 10/23/2009   Past Surgical History:  Procedure Laterality Date  . CESAREAN SECTION  11/19/2011   Procedure: CESAREAN SECTION;  Surgeon: Serita Kyle, MD;  Location: [facility];  Service: Gynecology;  Laterality: N/A;  Primary  . MOUTH SURGERY    . WISDOM TOOTH EXTRACTION      reports that she has never smoked. She has never used smokeless tobacco. She reports that she drinks alcohol. She reports that she does not use drugs. family history includes Arthritis in her maternal grandfather, maternal grandmother, and mother; Colon cancer in her maternal grandfather; Hypertension in her maternal grandfather, maternal grandmother, paternal grandfather, and paternal grandmother. Allergies  Allergen Reactions  . Codeine Itching     Review of Systems  Constitutional: Negative for activity change, appetite change, fatigue, fever and unexpected weight change.  HENT: Negative for ear pain, hearing loss, sore throat and trouble swallowing.   Eyes: Negative for visual disturbance.  Respiratory: Negative for cough and shortness of breath.   Cardiovascular: Negative for chest pain and palpitations.  Gastrointestinal: Negative for abdominal pain, blood in stool, constipation and diarrhea.   Genitourinary: Negative for dysuria and hematuria.  Musculoskeletal: Negative for arthralgias, back pain and myalgias.  Skin: Negative for rash.  Neurological: Negative for dizziness, syncope and headaches.  Hematological: Negative for adenopathy.  Psychiatric/Behavioral: Negative for confusion and dysphoric mood.       Objective:   Physical Exam  Constitutional: She is oriented to person, place, and time. She appears well-developed and well-nourished.  HENT:  Head: Normocephalic and atraumatic.  Eyes: Pupils are equal, round, and reactive to light. EOM are normal.  Neck: Normal range of motion. Neck supple. No thyromegaly present.  Cardiovascular: Normal rate, regular rhythm and normal heart sounds.   No murmur heard. Pulmonary/Chest: Breath sounds normal. No respiratory distress. She has no wheezes. She has no rales.  Abdominal: Soft. Bowel sounds are normal. She exhibits no distension and no mass. There is no tenderness. There is no rebound and no guarding.  Genitourinary:  Genitourinary Comments: Per gyn   Musculoskeletal: Normal range of motion. She exhibits no edema.  Lymphadenopathy:    She has no cervical adenopathy.  Neurological: She is alert and oriented to person, place, and time. She displays normal reflexes. No cranial nerve deficit.  Skin: No rash noted.  Psychiatric: She has a normal mood and affect. Her behavior is normal. Judgment and thought content normal.       Assessment:     Physical exam. Generally healthy 31 year old female. She has history of GERD which is currently stable on Protonix. She is getting yearly GYN follow-up    Plan:     -We discussed getting some screening labs since she's not had these in quite  some time -Try tapering Protonix back to every other day and use either Zantac 150 mg or Pepcid 20 mg on alternate days and after a few weeks if no symptom breakthrough consider transitioning to daily H2 blocker.  [name] MD [place]  [facility] at [facility]

## 2017-07-18 NOTE — Patient Instructions (Signed)
Consider tapering back the Protonix to one every other day and consider OTC Zantac 150 mg or Pepcid 20 mg on alternate days After 2-3 weeks try going to daily OTC meds.

## 2017-07-20 ENCOUNTER — Telehealth: Payer: Self-pay | Admitting: Family Medicine

## 2017-07-20 NOTE — Telephone Encounter (Signed)
Kristin Parks pt returned your call °

## 2017-07-20 NOTE — Telephone Encounter (Signed)
Patient is aware of lab results.

## 2017-12-26 ENCOUNTER — Other Ambulatory Visit: Payer: Self-pay | Admitting: Family Medicine

## 2018-01-11 ENCOUNTER — Other Ambulatory Visit: Payer: Self-pay | Admitting: Family Medicine

## 2018-06-24 ENCOUNTER — Other Ambulatory Visit: Payer: Self-pay | Admitting: Family Medicine

## 2018-06-28 ENCOUNTER — Other Ambulatory Visit: Payer: Self-pay | Admitting: Family Medicine

## 2018-09-06 ENCOUNTER — Other Ambulatory Visit: Payer: Self-pay

## 2018-09-06 ENCOUNTER — Encounter: Payer: Self-pay | Admitting: Family Medicine

## 2018-09-06 ENCOUNTER — Ambulatory Visit: Payer: Managed Care, Other (non HMO) | Admitting: Family Medicine

## 2018-09-06 VITALS — BP 112/78 | HR 88 | Temp 98.1°F | Ht 65.0 in | Wt 142.4 lb

## 2018-09-06 DIAGNOSIS — F410 Panic disorder [episodic paroxysmal anxiety] without agoraphobia: Secondary | ICD-10-CM | POA: Diagnosis not present

## 2018-09-06 DIAGNOSIS — R350 Frequency of micturition: Secondary | ICD-10-CM | POA: Diagnosis not present

## 2018-09-06 DIAGNOSIS — Z23 Encounter for immunization: Secondary | ICD-10-CM | POA: Diagnosis not present

## 2018-09-06 DIAGNOSIS — F4321 Adjustment disorder with depressed mood: Secondary | ICD-10-CM

## 2018-09-06 LAB — POCT URINALYSIS DIPSTICK
Bilirubin, UA: NEGATIVE
Blood, UA: NEGATIVE
Glucose, UA: NEGATIVE
KETONES UA: NEGATIVE
NITRITE UA: NEGATIVE
PH UA: 6 (ref 5.0–8.0)
PROTEIN UA: NEGATIVE
SPEC GRAV UA: 1.015 (ref 1.010–1.025)
UROBILINOGEN UA: 0.2 U/dL

## 2018-09-06 MED ORDER — SERTRALINE HCL 100 MG PO TABS: 100.0000 mg | ORAL_TABLET | Freq: Every day | ORAL | 3 refills | Status: DC

## 2018-09-06 MED ORDER — NITROFURANTOIN MONOHYD MACRO 100 MG PO CAPS: 100.0000 mg | ORAL_CAPSULE | Freq: Two times a day (BID) | ORAL | 0 refills | Status: DC
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
## 2018-09-06 NOTE — Patient Instructions (Signed)

## 2018-09-08 LAB — URINE CULTURE
MICRO NUMBER:: 91367715
SPECIMEN QUALITY:: ADEQUATE

## 2018-09-10 ENCOUNTER — Encounter: Payer: Self-pay | Admitting: Family Medicine

## 2018-09-28 ENCOUNTER — Ambulatory Visit: Payer: Managed Care, Other (non HMO) | Admitting: Adult Health

## 2018-09-28 ENCOUNTER — Encounter: Payer: Self-pay | Admitting: Adult Health

## 2018-09-28 VITALS — BP 94/60 | Temp 98.2°F | Wt 144.0 lb

## 2018-09-28 DIAGNOSIS — J40 Bronchitis, not specified as acute or chronic: Secondary | ICD-10-CM | POA: Diagnosis not present

## 2018-09-28 DIAGNOSIS — S299XXA Unspecified injury of thorax, initial encounter: Secondary | ICD-10-CM

## 2018-09-28 MED ORDER — BENZONATATE 200 MG PO CAPS: 200.0000 mg | ORAL_CAPSULE | Freq: Two times a day (BID) | ORAL | 0 refills | Status: DC | PRN

## 2018-09-28 MED ORDER — PREDNISONE 10 MG PO TABS: ORAL_TABLET | ORAL | 0 refills | Status: DC
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
## 2018-09-29 ENCOUNTER — Ambulatory Visit (INDEPENDENT_AMBULATORY_CARE_PROVIDER_SITE_OTHER): Payer: Managed Care, Other (non HMO)

## 2018-09-29 DIAGNOSIS — S299XXA Unspecified injury of thorax, initial encounter: Secondary | ICD-10-CM

## 2018-11-20 ENCOUNTER — Encounter: Payer: Self-pay | Admitting: Family Medicine

## 2018-11-20 ENCOUNTER — Ambulatory Visit: Payer: Managed Care, Other (non HMO) | Admitting: Family Medicine

## 2018-11-20 ENCOUNTER — Ambulatory Visit (INDEPENDENT_AMBULATORY_CARE_PROVIDER_SITE_OTHER): Payer: Managed Care, Other (non HMO)

## 2018-11-20 VITALS — BP 104/70 | HR 81 | Temp 98.9°F | Resp 12 | Ht 65.0 in | Wt 143.2 lb

## 2018-11-20 DIAGNOSIS — R0789 Other chest pain: Secondary | ICD-10-CM

## 2018-11-20 DIAGNOSIS — R05 Cough: Secondary | ICD-10-CM

## 2018-11-20 DIAGNOSIS — R059 Cough, unspecified: Secondary | ICD-10-CM

## 2018-11-20 DIAGNOSIS — M549 Dorsalgia, unspecified: Secondary | ICD-10-CM

## 2018-11-20 DIAGNOSIS — R0981 Nasal congestion: Secondary | ICD-10-CM

## 2018-11-20 MED ORDER — PREDNISONE 20 MG PO TABS: 40.0000 mg | ORAL_TABLET | Freq: Every day | ORAL | 0 refills | Status: AC

## 2018-11-20 MED ORDER — BENZONATATE 100 MG PO CAPS: 200.0000 mg | ORAL_CAPSULE | Freq: Two times a day (BID) | ORAL | 0 refills | Status: AC | PRN

## 2018-11-20 MED ORDER — CYCLOBENZAPRINE HCL 10 MG PO TABS: 10.0000 mg | ORAL_TABLET | Freq: Every evening | ORAL | 0 refills | Status: AC | PRN

## 2018-11-20 NOTE — Progress Notes (Signed)
ACUTE VISIT  HPI:  Chief Complaint  Patient presents with  . Cough    with yellow/green mucus x 2 1/2 weeks  . Chest Congestion  . Sinus Congestion    Kristin Parks is a 33 y.o.female here today complaining of 2-3 of respiratory symptoms. Productive cough with yellowish sputum,denies hemoptysis. "Little" wheezing, no dyspnea. No Hx of asthma or tobacco use.  A week ago she had fever 100.4 F and chills x 1 day.   Nasal congestion and post nasal drainage. Symptoms are worse in the morning.  About 3 weeks ago she was treated for strep throat.   A couple of days ago she started with right sided chest pain. Radiated to right-sided of neck and right upper back. Upper back pain exacerbated by neck movement. No hx of trauma,she has not noted rash or skin changes in affected area. Pain interferes with sleep.   Cough  This is a new problem. The current episode started 1 to 4 weeks ago. The problem has been unchanged. The cough is productive of sputum. Associated symptoms include chest pain, ear pain (Left), nasal congestion, postnasal drip, rhinorrhea and wheezing. Pertinent negatives include no eye redness, headaches, heartburn, hemoptysis, myalgias, rash, sore throat or shortness of breath. The symptoms are aggravated by lying down. She has tried OTC cough suppressant for the symptoms. The treatment provided mild relief. There is no history of asthma or environmental allergies.   No Hx of recent travel. No sick contact. No known insect bite.  OTC medications for this problem: Alka Seltzer cold.  Hx of GERD, she is on Protonix 40 mg. Negative for heartburn,nausea,vomiting,or abdominal pain.   Review of Systems  Constitutional: Positive for activity change, appetite change and fatigue.  HENT: Positive for congestion, ear pain (Left), postnasal drip, rhinorrhea and sinus pressure. Negative for hearing loss, mouth sores, sore throat, trouble swallowing and voice  change.   Eyes: Negative for discharge and redness.  Respiratory: Positive for cough and wheezing. Negative for hemoptysis and shortness of breath.   Cardiovascular: Positive for chest pain. Negative for palpitations and leg swelling.  Gastrointestinal: Negative for abdominal pain, diarrhea, heartburn, nausea and vomiting.  Musculoskeletal: Positive for back pain. Negative for gait problem and myalgias.  Skin: Negative for rash.  Allergic/Immunologic: Negative for environmental allergies.  Neurological: Negative for weakness, numbness and headaches.  Hematological: Negative for adenopathy. Does not bruise/bleed easily.  Psychiatric/Behavioral: Positive for sleep disturbance. The patient is nervous/anxious.      Current Outpatient Medications on File Prior to Visit  Medication Sig Dispense Refill  . norethindrone-ethinyl estradiol (JUNEL FE,GILDESS FE,LOESTRIN FE) 1-20 MG-MCG tablet Take 1 tablet by mouth daily. Per OB/GYN    . pantoprazole (PROTONIX) 40 MG tablet TAKE 1 TABLET BY MOUTH EVERY DAY 90 tablet 2  . sertraline (ZOLOFT) 100 MG tablet Take 1 tablet (100 mg total) by mouth daily. 90 tablet 3   No current facility-administered medications on file prior to visit.      Past Medical History:  Diagnosis Date  . ANEMIA-IRON DEFICIENCY 10/23/2009  . DEPRESSION 07/08/2010  . High risk HPV infection   . PANIC DISORDER 10/23/2009   Allergies  Allergen Reactions  . Codeine Itching    Social History   Socioeconomic History  . Marital status: Single    Spouse name: Not on file  . Number of children: 1  . Years of education: Not on file  . Highest education level: Not on file  Occupational  History  . Not on file  Social Needs  . Financial resource strain: Not on file  . Food insecurity:    Worry: Not on file    Inability: Not on file  . Transportation needs:    Medical: Not on file    Non-medical: Not on file  Tobacco Use  . Smoking status: Never Smoker  . Smokeless  tobacco: Never Used  Substance and Sexual Activity  . Alcohol use: Yes    Alcohol/week: 0.0 standard drinks    Comment: OCC  . Drug use: No  . Sexual activity: Yes    Partners: Male  Lifestyle  . Physical activity:    Days per week: Not on file    Minutes per session: Not on file  . Stress: Not on file  Relationships  . Social connections:    Talks on phone: Not on file    Gets together: Not on file    Attends religious service: Not on file    Active member of club or organization: Not on file    Attends meetings of clubs or organizations: Not on file    Relationship status: Not on file  Other Topics Concern  . Not on file  Social History Narrative  . Not on file    Vitals:   11/20/18 1432  BP: 104/70  Pulse: 81  Resp: 12  Temp: 98.9 F (37.2 C)  SpO2: 97%   Body mass index is 23.84 kg/m.  Physical Exam  Nursing note and vitals reviewed. Constitutional: She is oriented to person, place, and time. She appears well-developed and well-nourished. She does not appear ill. No distress.  HENT:  Head: Normocephalic and atraumatic.  Nose: Rhinorrhea present. Right sinus exhibits no maxillary sinus tenderness and no frontal sinus tenderness. Left sinus exhibits no maxillary sinus tenderness and no frontal sinus tenderness.  Mouth/Throat: Oropharynx is clear and moist and mucous membranes are normal.  Eyes: Conjunctivae are normal.  Cardiovascular: Normal rate and regular rhythm.  No murmur heard. Respiratory: Effort normal and breath sounds normal. No respiratory distress. She exhibits tenderness.  Musculoskeletal:     Cervical back: She exhibits normal range of motion, no tenderness and no bony tenderness.       Back:       Arms:     Comments: Tenderness upon palpation of chondrocostal joints right side. Right upper back pain elicited with cervical right-sided rotation.  Negative for pain upon palpation.  Lymphadenopathy:       Head (right side): No submandibular  adenopathy present.       Head (left side): No submandibular adenopathy present.    She has no cervical adenopathy.  Neurological: She is alert and oriented to person, place, and time. She has normal strength.  Skin: Skin is warm. No rash noted. No erythema.  Psychiatric: Her mood appears anxious.  Fairly groomed, good eye contact.      ASSESSMENT AND PLAN:  Kristin Parks was seen today for cough, chest congestion and sinus congestion.  Diagnoses and all orders for this visit:  Cough Lung auscultation negative. Possible etiologies discussed, including allergies,residual symptoms after URI,GERD. Adequate hydration. Further recommendations will be given according to imaging results.  -     DG Chest 2 View; Future -     benzonatate (TESSALON) 100 MG capsule; Take 2 capsules (200 mg total) by mouth 2 (two) times daily as needed for up to 10 days.  Chest wall pain Most likely musculoskeletal. Recommend avoiding shallow breathing. Deep  breaths a few times during the day also recommended. Instructed about warning signs.  -     DG Chest 2 View; Future  Upper back pain on right side Local icy hot or Asper cream with Lidocaine may help. Local massage. Side effects of Flexeril discussed.  -     cyclobenzaprine (FLEXERIL) 10 MG tablet; Take 1 tablet (10 mg total) by mouth at bedtime as needed for up to 30 days for muscle spasms.  Nasal sinus congestion I do not think abx is needed at this time. Short course of Prednisone may help. Side effects discussed. OTC antihistaminics and nasal irrigations with saline several times per day may also help.  F/U with pcp as needed.  -     predniSONE (DELTASONE) 20 MG tablet; Take 2 tablets (40 mg total) by mouth daily with breakfast for 3 days.     Return if symptoms worsen or fail to improve.      Elaijah Munoz G. [place], MD  [facility]. [place] office.

## 2018-11-20 NOTE — Patient Instructions (Signed)
  [name] I have seen you today for an acute visit.  A few things to remember from today's visit:   Cough - Plan: DG Chest 2 View, benzonatate (TESSALON) 100 MG capsule  Chest wall pain - Plan: DG Chest 2 View  Upper back pain on right side - Plan: cyclobenzaprine (FLEXERIL) 10 MG tablet   If medications prescribed today, they will not be refill upon request, a follow up appointment with PCP will be necessary to discuss continuation of of treatment if appropriate.     In general please monitor for signs of worsening symptoms and seek immediate medical attention if any concerning.  If symptoms are not resolved in 2-3 weeks you should schedule a follow up appointment with your doctor, before if needed.  I hope you get better soon!

## 2018-11-21 ENCOUNTER — Encounter: Payer: Self-pay | Admitting: Family Medicine

## 2019-01-23 ENCOUNTER — Other Ambulatory Visit: Payer: Self-pay

## 2019-01-23 ENCOUNTER — Ambulatory Visit (INDEPENDENT_AMBULATORY_CARE_PROVIDER_SITE_OTHER): Payer: Self-pay | Admitting: Family Medicine

## 2019-01-23 DIAGNOSIS — J029 Acute pharyngitis, unspecified: Secondary | ICD-10-CM

## 2019-01-23 MED ORDER — CEPHALEXIN 500 MG PO CAPS: 500.0000 mg | ORAL_CAPSULE | Freq: Three times a day (TID) | ORAL | 0 refills | Status: DC

## 2019-01-23 NOTE — Progress Notes (Signed)
Patient ID: Kristin Parks, female   DOB: 1986-06-10, 33 y.o.   MRN: [id number]  Virtual Visit via Video Note  I connected with Kristin Parks on 01/23/19 at 10:30 AM EDT by a video enabled telemedicine application and verified that I am speaking with the correct person using two identifiers.  Location patient: home Location provider:work or home office Persons participating in the virtual visit: patient, provider  I discussed the limitations of evaluation and management by telemedicine and the availability of in person appointments. The patient expressed understanding and agreed to proceed.   HPI: Patient called in with recurrent sore throat.  She has had documented strep pharyngitis a few times earlier this year most recently in January.  Each time she was treated with penicillin and did improve.  Her current symptoms started a day ago.  She describes some whitish to yellowish exudate in her posterior pharynx along with swollen anterior cervical nodes bilaterally.  She thinks she had some low-grade fever.  Mild body aches.  Denies any nasal congestion or cough.  No dyspnea.  She is taken some ibuprofen which helps her symptoms.  She states this is exactly the way she has felt with strep pharyngitis in the past.  No rash.  No nausea or vomiting.  Keeping down fluids   ROS: See pertinent positives and negatives per HPI.  Past Medical History:  Diagnosis Date  . ANEMIA-IRON DEFICIENCY 10/23/2009  . DEPRESSION 07/08/2010  . High risk HPV infection   . PANIC DISORDER 10/23/2009    Past Surgical History:  Procedure Laterality Date  . CESAREAN SECTION  11/19/2011   Procedure: CESAREAN SECTION;  Surgeon: Serita Kyle, MD;  Location: [facility];  Service: Gynecology;  Laterality: N/A;  Primary  . MOUTH SURGERY    . WISDOM TOOTH EXTRACTION      Family History  Problem Relation Age of Onset  . Arthritis Mother   . Hypertension Maternal Grandmother   . Arthritis Maternal Grandmother   .  Hypertension Maternal Grandfather   . Arthritis Maternal Grandfather   . Colon cancer Maternal Grandfather   . Hypertension Paternal Grandmother   . Hypertension Paternal Grandfather     SOCIAL HX: Non-smoker   Current Outpatient Medications:  .  norethindrone-ethinyl estradiol (JUNEL FE,GILDESS FE,LOESTRIN FE) 1-20 MG-MCG tablet, Take 1 tablet by mouth daily. Per OB/GYN, Disp: , Rfl:  .  pantoprazole (PROTONIX) 40 MG tablet, TAKE 1 TABLET BY MOUTH EVERY DAY, Disp: 90 tablet, Rfl: 2 .  sertraline (ZOLOFT) 100 MG tablet, Take 1 tablet (100 mg total) by mouth daily., Disp: 90 tablet, Rfl: 3  EXAM:  VITALS per patient if applicable:  GENERAL: alert, oriented, appears well and in no acute distress  HEENT: atraumatic, conjunttiva clear, no obvious abnormalities on inspection of external nose and ears  NECK: normal movements of the head and neck  LUNGS: on inspection no signs of respiratory distress, breathing rate appears normal, no obvious gross SOB, gasping or wheezing  CV: no obvious cyanosis  MS: moves all visible extremities without noticeable abnormality  PSYCH/NEURO: pleasant and cooperative, no obvious depression or anxiety, speech and thought processing grossly intact  ASSESSMENT AND PLAN:  Discussed the following assessment and plan:  Acute pharyngitis, unspecified etiology-patient has history of recurrent strep.  Lack of cough and nasal congestive symptoms along with presence of fever and exudate make this likely to be recurrent strep.  Given current circumstances with Covidien-19 crisis we have elected to go ahead and treat empirically  -Keflex  500 mg 3 times daily for 10 days -Patient will consider ENT consult if she continues to get recurrent strep -Follow-up for any persistent or worsening symptoms     I discussed the assessment and treatment plan with the patient. The patient was provided an opportunity to ask questions and all were answered. The patient agreed  with the plan and demonstrated an understanding of the instructions.   The patient was advised to call back or seek an in-person evaluation if the symptoms worsen or if the condition fails to improve as anticipated.   Evelena Peat, MD

## 2019-06-10 ENCOUNTER — Other Ambulatory Visit: Payer: Self-pay | Admitting: Family Medicine

## 2019-09-08 ENCOUNTER — Other Ambulatory Visit: Payer: Self-pay | Admitting: Family Medicine

## 2019-09-11 ENCOUNTER — Other Ambulatory Visit: Payer: Self-pay | Admitting: Family Medicine

## 2019-09-14 ENCOUNTER — Telehealth: Payer: Self-pay | Admitting: Family Medicine

## 2019-09-14 NOTE — Telephone Encounter (Signed)
rx refill sertraline (ZOLOFT) 100 MG tablet  [facility]ARM[facility]Y [id number] - [place], [place] - [address] [phone] (Phone) [phone] (Fax)   Patient is requesting a one month supply.

## 2019-09-14 NOTE — Telephone Encounter (Signed)
This was sent in today.  

## 2019-10-09 ENCOUNTER — Other Ambulatory Visit: Payer: Self-pay | Admitting: Family Medicine

## 2019-11-06 ENCOUNTER — Other Ambulatory Visit: Payer: Self-pay | Admitting: Family Medicine

## 2019-12-01 ENCOUNTER — Other Ambulatory Visit: Payer: Self-pay | Admitting: Family Medicine

## 2019-12-05 ENCOUNTER — Ambulatory Visit: Payer: Self-pay | Attending: Internal Medicine

## 2019-12-05 ENCOUNTER — Telehealth (INDEPENDENT_AMBULATORY_CARE_PROVIDER_SITE_OTHER): Payer: Self-pay | Admitting: Family Medicine

## 2019-12-05 ENCOUNTER — Other Ambulatory Visit: Payer: Self-pay

## 2019-12-05 DIAGNOSIS — R5383 Other fatigue: Secondary | ICD-10-CM

## 2019-12-05 DIAGNOSIS — F4321 Adjustment disorder with depressed mood: Secondary | ICD-10-CM

## 2019-12-05 DIAGNOSIS — K219 Gastro-esophageal reflux disease without esophagitis: Secondary | ICD-10-CM

## 2019-12-05 DIAGNOSIS — Z20822 Contact with and (suspected) exposure to covid-19: Secondary | ICD-10-CM

## 2019-12-05 MED ORDER — PANTOPRAZOLE SODIUM 40 MG PO TBEC: 40.0000 mg | DELAYED_RELEASE_TABLET | Freq: Every day | ORAL | 1 refills | Status: DC

## 2019-12-05 MED ORDER — SERTRALINE HCL 100 MG PO TABS: 100.0000 mg | ORAL_TABLET | Freq: Every day | ORAL | 3 refills | Status: DC

## 2019-12-05 NOTE — Progress Notes (Signed)
This visit type was conducted due to national recommendations for restrictions regarding the COVID-19 pandemic in an effort to limit this patient's exposure and mitigate transmission in our community.   Virtual Visit via Telephone Note  I connected with Kristin Parks on 12/05/19 at 11:15 AM EST by telephone and verified that I am speaking with the correct person using two identifiers.   I discussed the limitations, risks, security and privacy concerns of performing an evaluation and management service by telephone and the availability of in person appointments. I also discussed with the patient that there may be a patient responsible charge related to this service. The patient expressed understanding and agreed to proceed.  Location patient: home Location provider: work or home office Participants present for the call: patient, provider Patient did not have a visit in the prior 7 days to address this/these issue(s).   History of Present Illness:   Kristin Parks has history of GERD and recurrent depression.  She has been on Protonix for quite some time.  She had EGD back in 2016 which showed some gastritis changes.  She is aware of recommendations to try to consider tapering off PPI.  No history of Barrett's esophagus changes.  She has not tried any recent tapering of Protonix.  Denies any recent reflux symptoms or dysphagia.  She has history of recurrent depression which is currently stable on sertraline 100 mg daily.  Requesting refills.  She has had some nonspecific symptoms of intermittent headache, fatigue, and body aches past week.  No fever.  Only mild cough.  No sick contacts.  No diarrhea.  Past Medical History:  Diagnosis Date  . ANEMIA-IRON DEFICIENCY 10/23/2009  . DEPRESSION 07/08/2010  . High risk HPV infection   . PANIC DISORDER 10/23/2009   Past Surgical History:  Procedure Laterality Date  . CESAREAN SECTION  11/19/2011   Procedure: CESAREAN SECTION;  Surgeon: Marvene Staff,  MD;  Location: [facility];  Service: Gynecology;  Laterality: N/A;  Primary  . MOUTH SURGERY    . WISDOM TOOTH EXTRACTION      reports that she has never smoked. She has never used smokeless tobacco. She reports current alcohol use. She reports that she does not use drugs. family history includes Arthritis in her maternal grandfather, maternal grandmother, and mother; Colon cancer in her maternal grandfather; Hypertension in her maternal grandfather, maternal grandmother, paternal grandfather, and paternal grandmother. Allergies  Allergen Reactions  . Codeine Itching    Observations/Objective: Patient sounds cheerful and well on the phone. I do not appreciate any SOB. Speech and thought processing are grossly intact. Patient reported vitals:  Assessment and Plan:  #1 history of recurrent depression stable on sertraline.  Patient reluctant to discontinue at this time  -Refill sertraline for 1 year  #2 history of GERD.  She has been on Protonix for several years now.  Symptoms currently stable.  We need to give her a trial of tapering off especially at her age because of potential for reduced calcium, magnesium, and B12 absorption  -Recommend tapering back Protonix to every other day and try Pepcid 20 mg twice daily on off days and eventually transition to Pepcid if tolerated.  She will let us know she has any breakthrough symptoms.  We did give her 1 refill of Protonix until she can try tapering off  #3 nonspecific symptoms of fatigue body aches and headache.  No reported fever.  -We recommend Covid testing and she was given a link to get this scheduled. -If  Covid testing negative we recommended further labs with TSH, CBC, BMP, hepatic panel.  Future lab orders were placed.  She has had prior history of iron deficiency anemia -We recommended quarantine until further clarified  Follow Up Instructions:  -As above.  She will schedule labs for next week if Covid testing negative   [id number]  5-10 [id number] 11-20 [id number] 21-30 I did not refer this patient for an OV in the next 24 hours for this/these issue(s).  I discussed the assessment and treatment plan with the patient. The patient was provided an opportunity to ask questions and all were answered. The patient agreed with the plan and demonstrated an understanding of the instructions.   The patient was advised to call back or seek an in-person evaluation if the symptoms worsen or if the condition fails to improve as anticipated.  I provided 25 minutes of non-face-to-face time during this encounter.   Evelena Peat, MD

## 2019-12-06 LAB — NOVEL CORONAVIRUS, NAA: SARS-CoV-2, NAA: DETECTED — AB

## 2020-01-01 ENCOUNTER — Other Ambulatory Visit: Payer: Self-pay

## 2020-01-02 ENCOUNTER — Encounter: Payer: Self-pay | Admitting: Family Medicine

## 2020-01-02 ENCOUNTER — Ambulatory Visit (INDEPENDENT_AMBULATORY_CARE_PROVIDER_SITE_OTHER): Payer: Commercial Managed Care - PPO | Admitting: Family Medicine

## 2020-01-02 ENCOUNTER — Ambulatory Visit (INDEPENDENT_AMBULATORY_CARE_PROVIDER_SITE_OTHER): Payer: Commercial Managed Care - PPO

## 2020-01-02 VITALS — BP 118/78 | HR 78 | Temp 98.0°F | Ht 65.0 in | Wt 164.1 lb

## 2020-01-02 DIAGNOSIS — R5383 Other fatigue: Secondary | ICD-10-CM | POA: Diagnosis not present

## 2020-01-02 DIAGNOSIS — M25561 Pain in right knee: Secondary | ICD-10-CM

## 2020-01-02 LAB — CBC WITH DIFFERENTIAL/PLATELET
Basophils Absolute: 0 10*3/uL (ref 0.0–0.1)
Basophils Relative: 0.7 % (ref 0.0–3.0)
Eosinophils Absolute: 0.1 10*3/uL (ref 0.0–0.7)
Eosinophils Relative: 1.3 % (ref 0.0–5.0)
HCT: 36.5 % (ref 36.0–46.0)
Hemoglobin: 12.2 g/dL (ref 12.0–15.0)
Lymphocytes Relative: 28.2 % (ref 12.0–46.0)
Lymphs Abs: 1.1 10*3/uL (ref 0.7–4.0)
MCHC: 33.5 g/dL (ref 30.0–36.0)
MCV: 94.2 fl (ref 78.0–100.0)
Monocytes Absolute: 0.4 10*3/uL (ref 0.1–1.0)
Monocytes Relative: 10.9 % (ref 3.0–12.0)
Neutro Abs: 2.4 10*3/uL (ref 1.4–7.7)
Neutrophils Relative %: 58.9 % (ref 43.0–77.0)
Platelets: 320 10*3/uL (ref 150.0–400.0)
RBC: 3.88 Mil/uL (ref 3.87–5.11)
RDW: 13.1 % (ref 11.5–15.5)
WBC: 4 10*3/uL (ref 4.0–10.5)

## 2020-01-02 LAB — HEPATIC FUNCTION PANEL
ALT: 10 U/L (ref 0–35)
AST: 13 U/L (ref 0–37)
Albumin: 4.3 g/dL (ref 3.5–5.2)
Alkaline Phosphatase: 45 U/L (ref 39–117)
Bilirubin, Direct: 0.1 mg/dL (ref 0.0–0.3)
Total Bilirubin: 0.6 mg/dL (ref 0.2–1.2)
Total Protein: 7.1 g/dL (ref 6.0–8.3)

## 2020-01-02 LAB — BASIC METABOLIC PANEL
BUN: 9 mg/dL (ref 6–23)
CO2: 29 mEq/L (ref 19–32)
Calcium: 9.3 mg/dL (ref 8.4–10.5)
Chloride: 104 mEq/L (ref 96–112)
Creatinine, Ser: 0.74 mg/dL (ref 0.40–1.20)
GFR: 89.56 mL/min (ref >60.00)
Glucose, Bld: 62 mg/dL — ABNORMAL LOW (ref 70–99)
Potassium: 4.1 mEq/L (ref 3.5–5.1)
Sodium: 139 mEq/L (ref 135–145)

## 2020-01-02 LAB — SEDIMENTATION RATE: Sed Rate: 19 mm/hr (ref 0–20)

## 2020-01-02 LAB — TSH: TSH: 1.41 u[IU]/mL (ref 0.35–4.50)

## 2020-01-02 NOTE — Patient Instructions (Signed)
No fractures seen  Your have traumatic patellar bursitis  May take several weeks for bursae swelling to go down  May do some icing to control swelling.

## 2020-01-02 NOTE — Progress Notes (Signed)
  Subjective:     Patient ID: Kristin Parks, female   DOB: [birth year], 34 y.o.   MRN: [id number]  HPI   Kristin Parks is seen with right knee pain following fall 2 weeks ago.  She landed on her anterior knee and had some immediate swelling and bruising of the right knee.  She had some persistent swelling and pain mostly anterior knee but somewhat poorly localized.  She is able to bear weight without difficulty.  She did have an initial abrasion which is healing.  Past Medical History:  Diagnosis Date  . ANEMIA-IRON DEFICIENCY 10/23/2009  . DEPRESSION 07/08/2010  . High risk HPV infection   . PANIC DISORDER 10/23/2009   Past Surgical History:  Procedure Laterality Date  . CESAREAN SECTION  11/19/2011   Procedure: CESAREAN SECTION;  Surgeon: Serita Kyle, MD;  Location: [facility];  Service: Gynecology;  Laterality: N/A;  Primary  . MOUTH SURGERY    . WISDOM TOOTH EXTRACTION      reports that she has never smoked. She has never used smokeless tobacco. She reports current alcohol use. She reports that she does not use drugs. family history includes Arthritis in her maternal grandfather, maternal grandmother, and mother; Colon cancer in her maternal grandfather; Hypertension in her maternal grandfather, maternal grandmother, paternal grandfather, and paternal grandmother. Allergies  Allergen Reactions  . Codeine Itching     Review of Systems  Constitutional: Negative for chills and fever.       Objective:   Physical Exam Vitals reviewed.  Constitutional:      Appearance: Normal appearance.  Musculoskeletal:     Comments: Right knee reveals no visible ecchymosis.  She has some mild prepatellar edema and tenderness over the patellar region.  She has some medial and mild lateral joint line tenderness as well but no evidence for knee effusion.  Full range of motion knee. No warmth and no erythema.  Neurological:     Mental Status: She is alert.        Assessment:     Acute right knee  pain following fall.  X-rays reveal no fracture including sunrise view.  She has evidence for traumatic prepatellar bursitis.  No signs of secondary infection    Plan:     -Reassurance.  She may try some icing 2-3 times daily as needed.  We have instructed her that it may take several weeks for her swelling to fully resolve.  Try to avoid pressure on the knee. She does not have a large prepatellar bursa effusion and we would not recommend aspiration as fluid would likely reaccumulate and also would introduce the increased risk of infection  [name] MD [facility]

## 2020-01-03 ENCOUNTER — Encounter: Payer: Self-pay | Admitting: Family Medicine

## 2020-03-03 ENCOUNTER — Other Ambulatory Visit: Payer: Self-pay | Admitting: Family Medicine

## 2020-03-14 ENCOUNTER — Other Ambulatory Visit: Payer: Self-pay | Admitting: Family Medicine

## 2020-04-08 ENCOUNTER — Encounter: Payer: Self-pay | Admitting: Family Medicine

## 2020-04-08 ENCOUNTER — Other Ambulatory Visit: Payer: Self-pay

## 2020-04-08 ENCOUNTER — Ambulatory Visit (INDEPENDENT_AMBULATORY_CARE_PROVIDER_SITE_OTHER): Payer: Commercial Managed Care - PPO | Admitting: Family Medicine

## 2020-04-08 VITALS — BP 114/72 | HR 95 | Temp 97.7°F | Wt 168.2 lb

## 2020-04-08 DIAGNOSIS — F419 Anxiety disorder, unspecified: Secondary | ICD-10-CM | POA: Diagnosis not present

## 2020-04-08 MED ORDER — LORAZEPAM 0.5 MG PO TABS: 0.5000 mg | ORAL_TABLET | Freq: Three times a day (TID) | ORAL | 0 refills | Status: DC | PRN

## 2020-04-08 MED ORDER — FLUOXETINE HCL 20 MG PO TABS: 20.0000 mg | ORAL_TABLET | Freq: Every day | ORAL | 3 refills | Status: DC

## 2020-04-08 NOTE — Progress Notes (Signed)
Established Patient Office Visit  Subjective:  Patient ID: Kristin Parks, female    DOB: Nov 18, 1985  Age: 34 y.o. MRN: [id number]  CC:  Chief Complaint  Patient presents with  . Medication Refill    pt wants to discuss upping medication or switching     HPI Kristin Parks presents for follow-up to discuss anxiety symptoms.  She has had history of panic disorder.  She recently started a new job.  She is having almost daily anxiety symptoms.  She frequently has physical symptoms such as chest tightness.  Never with exertion.  She feels very anxious at times without any clear provocation.  She is currently on sertraline 100 mg daily for this for years has controlled anxiety symptoms.  She would like to consider switching.  Denies any major depression symptoms at this time.  She had labs done back in March which included thyroid functions and these were normal.  Other than job change no other specific stressors.  She has gained a lot of weight this year which she attributes to stress and anxiety  Past Medical History:  Diagnosis Date  . ANEMIA-IRON DEFICIENCY 10/23/2009  . DEPRESSION 07/08/2010  . High risk HPV infection   . PANIC DISORDER 10/23/2009    Past Surgical History:  Procedure Laterality Date  . CESAREAN SECTION  11/19/2011   Procedure: CESAREAN SECTION;  Surgeon: Marvene Staff, MD;  Location: [facility];  Service: Gynecology;  Laterality: N/A;  Primary  . MOUTH SURGERY    . WISDOM TOOTH EXTRACTION      Family History  Problem Relation Age of Onset  . Arthritis Mother   . Hypertension Maternal Grandmother   . Arthritis Maternal Grandmother   . Hypertension Maternal Grandfather   . Arthritis Maternal Grandfather   . Colon cancer Maternal Grandfather   . Hypertension Paternal Grandmother   . Hypertension Paternal Grandfather     Social History   Socioeconomic History  . Marital status: Single    Spouse name: Not on file  . Number of children: 1  . Years of  education: Not on file  . Highest education level: Not on file  Occupational History  . Not on file  Tobacco Use  . Smoking status: Never Smoker  . Smokeless tobacco: Never Used  Vaping Use  . Vaping Use: Former  Substance and Sexual Activity  . Alcohol use: Yes    Alcohol/week: 0.0 standard drinks    Comment: OCC  . Drug use: No  . Sexual activity: Yes    Partners: Male  Other Topics Concern  . Not on file  Social History Narrative  . Not on file   Social Determinants of Health   Financial Resource Strain:   . Difficulty of Paying Living Expenses:   Food Insecurity:   . Worried About Charity fundraiser in the Last Year:   . Arboriculturist in the Last Year:   Transportation Needs:   . Film/video editor (Medical):   Marland Kitchen Lack of Transportation (Non-Medical):   Physical Activity:   . Days of Exercise per Week:   . Minutes of Exercise per Session:   Stress:   . Feeling of Stress :   Social Connections:   . Frequency of Communication with Friends and Family:   . Frequency of Social Gatherings with Friends and Family:   . Attends Religious Services:   . Active Member of Clubs or Organizations:   . Attends Archivist Meetings:   .  Marital Status:   Intimate Partner Violence:   . Fear of Current or Ex-Partner:   . Emotionally Abused:   Marland Kitchen Physically Abused:   . Sexually Abused:     Outpatient Medications Prior to Visit  Medication Sig Dispense Refill  . norethindrone-ethinyl estradiol (JUNEL FE,GILDESS FE,LOESTRIN FE) 1-20 MG-MCG tablet Take 1 tablet by mouth daily. Per OB/GYN    . pantoprazole (PROTONIX) 40 MG tablet TAKE 1 TABLET BY MOUTH EVERY DAY 30 tablet 5  . sertraline (ZOLOFT) 100 MG tablet Take 1 tablet (100 mg total) by mouth daily. 90 tablet 3   No facility-administered medications prior to visit.    Allergies  Allergen Reactions  . Codeine Itching    ROS Review of Systems  Constitutional: Negative for fatigue.  Eyes: Negative for  visual disturbance.  Respiratory: Negative for cough, chest tightness, shortness of breath and wheezing.   Cardiovascular: Negative for chest pain, palpitations and leg swelling.  Neurological: Negative for dizziness, seizures, syncope, weakness, light-headedness and headaches.  Psychiatric/Behavioral: Positive for sleep disturbance. Negative for agitation, dysphoric mood and suicidal ideas. The patient is nervous/anxious.       Objective:    Physical Exam Vitals reviewed.  Constitutional:      Appearance: Normal appearance.  Cardiovascular:     Rate and Rhythm: Normal rate and regular rhythm.  Pulmonary:     Effort: Pulmonary effort is normal.     Breath sounds: Normal breath sounds.  Musculoskeletal:     Cervical back: Neck supple.     Right lower leg: No edema.     Left lower leg: No edema.  Lymphadenopathy:     Cervical: No cervical adenopathy.  Neurological:     Mental Status: She is alert.  Psychiatric:        Mood and Affect: Mood normal.        Thought Content: Thought content normal.     BP 114/72 (BP Location: Left Arm, Patient Position: Sitting, Cuff Size: Normal)   Pulse 95   Temp 97.7 F (36.5 C) (Temporal)   Wt 168 lb 3.2 oz (76.3 kg)   SpO2 99%   BMI 27.99 kg/m  Wt Readings from Last 3 Encounters:  04/08/20 168 lb 3.2 oz (76.3 kg)  01/02/20 164 lb 1.6 oz (74.4 kg)  11/20/18 143 lb 4 oz (65 kg)     Health Maintenance Due  Topic Date Due  . Hepatitis C Screening  Never done  . COVID-19 Vaccine (1) Never done  . MAMMOGRAM  Never done  . PAP SMEAR-Modifier  06/26/2011    There are no preventive care reminders to display for this patient.  Lab Results  Component Value Date   TSH 1.41 01/02/2020   Lab Results  Component Value Date   WBC 4.0 01/02/2020   HGB 12.2 01/02/2020   HCT 36.5 01/02/2020   MCV 94.2 01/02/2020   PLT 320.0 01/02/2020   Lab Results  Component Value Date   NA 139 01/02/2020   K 4.1 01/02/2020   CO2 29 01/02/2020    GLUCOSE 62 (L) 01/02/2020   BUN 9 01/02/2020   CREATININE 0.74 01/02/2020   BILITOT 0.6 01/02/2020   ALKPHOS 45 01/02/2020   AST 13 01/02/2020   ALT 10 01/02/2020   PROT 7.1 01/02/2020   ALBUMIN 4.3 01/02/2020   CALCIUM 9.3 01/02/2020   GFR 89.56 01/02/2020   Lab Results  Component Value Date   CHOL 154 07/18/2017   Lab Results  Component Value Date  HDL 77.70 07/18/2017   Lab Results  Component Value Date   LDLCALC 53 07/18/2017   Lab Results  Component Value Date   TRIG 115.0 07/18/2017   Lab Results  Component Value Date   CHOLHDL 2 07/18/2017   No results found for: HGBA1C    Assessment & Plan:   Chronic anxiety.  History of probable panic disorder..  She seems to have some more generalized anxiety at this time.  Has been on sertraline for several years but is recently had more frequent breakthrough anxiety symptoms  -Long discussion regarding nonpharmacologic factors to manage anxiety such as adequate sleep, regular exercise, healthy diet, minimize caffeine use, engagement in hobbies, etc. -We will discontinue sertraline and start Prozac 20 mg once daily -Wrote for limited lorazepam 0.5 mg take 1 every 8 hours only as needed for severe anxiety symptoms  Meds ordered this encounter  Medications  . FLUoxetine (PROZAC) 20 MG tablet    Sig: Take 1 tablet (20 mg total) by mouth daily.    Dispense:  30 tablet    Refill:  3  . LORazepam (ATIVAN) 0.5 MG tablet    Sig: Take 1 tablet (0.5 mg total) by mouth every 8 (eight) hours as needed for anxiety.    Dispense:  30 tablet    Refill:  0    Follow-up: Return in about 1 month (around 05/08/2020).    Evelena Peat, MD

## 2020-04-08 NOTE — Patient Instructions (Signed)

## 2020-06-11 ENCOUNTER — Ambulatory Visit (INDEPENDENT_AMBULATORY_CARE_PROVIDER_SITE_OTHER): Payer: Commercial Managed Care - PPO | Admitting: Family Medicine

## 2020-06-11 ENCOUNTER — Other Ambulatory Visit: Payer: Self-pay

## 2020-06-11 ENCOUNTER — Encounter: Payer: Self-pay | Admitting: Family Medicine

## 2020-06-11 VITALS — BP 120/68 | HR 85 | Temp 98.5°F | Wt 163.8 lb

## 2020-06-11 DIAGNOSIS — F410 Panic disorder [episodic paroxysmal anxiety] without agoraphobia: Secondary | ICD-10-CM

## 2020-06-11 NOTE — Progress Notes (Signed)
Established Patient Office Visit  Subjective:  Patient ID: Kristin Parks, female    DOB: 1986/03/18  Age: 34 y.o. MRN: [id number]  CC:  Chief Complaint  Patient presents with  . Anxiety    pt wants to talk about upping medication     HPI [name] presents for follow-up regarding anxiety issues.  She has longstanding history of anxiety and presumed panic disorder.  She has been having recently almost daily anxiety symptoms.  She frequently has fear of dying.  She has had difficulty sleeping.  We recently put her on fluoxetine 20 mg daily.  She is tolerating without side effects.  She had considered whether she should titrate.  We had also prescribed lorazepam which she is not taking regularly.  She has had substantial counseling in the past but does not feel this is helped much.  She has some depression symptoms but predominately anxiety.  No suicidal ideation. Sometimes has chest pains, palpitations, tremors, sweats related to her anxiety symptoms.  No exertional chest pain.  Past Medical History:  Diagnosis Date  . ANEMIA-IRON DEFICIENCY 10/23/2009  . DEPRESSION 07/08/2010  . High risk HPV infection   . PANIC DISORDER 10/23/2009    Past Surgical History:  Procedure Laterality Date  . CESAREAN SECTION  11/19/2011   Procedure: CESAREAN SECTION;  Surgeon: Serita Kyle, MD;  Location: [facility];  Service: Gynecology;  Laterality: N/A;  Primary  . MOUTH SURGERY    . WISDOM TOOTH EXTRACTION      Family History  Problem Relation Age of Onset  . Arthritis Mother   . Hypertension Maternal Grandmother   . Arthritis Maternal Grandmother   . Hypertension Maternal Grandfather   . Arthritis Maternal Grandfather   . Colon cancer Maternal Grandfather   . Hypertension Paternal Grandmother   . Hypertension Paternal Grandfather     Social History   Socioeconomic History  . Marital status: Single    Spouse name: Not on file  . Number of children: 1  . Years of education: Not on  file  . Highest education level: Not on file  Occupational History  . Not on file  Tobacco Use  . Smoking status: Never Smoker  . Smokeless tobacco: Never Used  Vaping Use  . Vaping Use: Former  Substance and Sexual Activity  . Alcohol use: Yes    Alcohol/week: 0.0 standard drinks    Comment: OCC  . Drug use: No  . Sexual activity: Yes    Partners: Male  Other Topics Concern  . Not on file  Social History Narrative  . Not on file   Social Determinants of Health   Financial Resource Strain:   . Difficulty of Paying Living Expenses:   Food Insecurity:   . Worried About Programme researcher, broadcasting/film/video in the Last Year:   . Barista in the Last Year:   Transportation Needs:   . Freight forwarder (Medical):   Marland Kitchen Lack of Transportation (Non-Medical):   Physical Activity:   . Days of Exercise per Week:   . Minutes of Exercise per Session:   Stress:   . Feeling of Stress :   Social Connections:   . Frequency of Communication with Friends and Family:   . Frequency of Social Gatherings with Friends and Family:   . Attends Religious Services:   . Active Member of Clubs or Organizations:   . Attends Banker Meetings:   Marland Kitchen Marital Status:   Intimate Partner Violence:   .  Fear of Current or Ex-Partner:   . Emotionally Abused:   Marland Kitchen Physically Abused:   . Sexually Abused:     Outpatient Medications Prior to Visit  Medication Sig Dispense Refill  . FLUoxetine (PROZAC) 20 MG tablet Take 1 tablet (20 mg total) by mouth daily. 30 tablet 3  . LORazepam (ATIVAN) 0.5 MG tablet Take 1 tablet (0.5 mg total) by mouth every 8 (eight) hours as needed for anxiety. 30 tablet 0  . norethindrone-ethinyl estradiol (JUNEL FE,GILDESS FE,LOESTRIN FE) 1-20 MG-MCG tablet Take 1 tablet by mouth daily. Per OB/GYN     No facility-administered medications prior to visit.    Allergies  Allergen Reactions  . Codeine Itching    ROS Review of Systems  Constitutional: Negative for  appetite change, chills and fever.  Respiratory: Negative for cough.   Cardiovascular: Positive for chest pain.  Psychiatric/Behavioral: Positive for sleep disturbance. Negative for suicidal ideas. The patient is nervous/anxious.       Objective:    Physical Exam Vitals reviewed.  Constitutional:      Appearance: Normal appearance.  Cardiovascular:     Rate and Rhythm: Normal rate and regular rhythm.  Pulmonary:     Effort: Pulmonary effort is normal.     Breath sounds: Normal breath sounds.  Musculoskeletal:     Cervical back: Neck supple.  Neurological:     General: No focal deficit present.     Mental Status: She is alert.     Cranial Nerves: No cranial nerve deficit.  Psychiatric:        Judgment: Judgment normal.     BP 120/68 (BP Location: Left Arm, Patient Position: Sitting, Cuff Size: Normal)   Pulse 85   Temp 98.5 F (36.9 C) (Oral)   Wt 163 lb 12.8 oz (74.3 kg)   SpO2 97%   BMI 27.26 kg/m  Wt Readings from Last 3 Encounters:  06/11/20 163 lb 12.8 oz (74.3 kg)  04/08/20 168 lb 3.2 oz (76.3 kg)  01/02/20 164 lb 1.6 oz (74.4 kg)     Health Maintenance Due  Topic Date Due  . Hepatitis C Screening  Never done  . COVID-19 Vaccine (1) Never done  . MAMMOGRAM  Never done  . PAP SMEAR-Modifier  06/26/2011  . INFLUENZA VACCINE  05/25/2020    There are no preventive care reminders to display for this patient.  Lab Results  Component Value Date   TSH 1.41 01/02/2020   Lab Results  Component Value Date   WBC 4.0 01/02/2020   HGB 12.2 01/02/2020   HCT 36.5 01/02/2020   MCV 94.2 01/02/2020   PLT 320.0 01/02/2020   Lab Results  Component Value Date   NA 139 01/02/2020   K 4.1 01/02/2020   CO2 29 01/02/2020   GLUCOSE 62 (L) 01/02/2020   BUN 9 01/02/2020   CREATININE 0.74 01/02/2020   BILITOT 0.6 01/02/2020   ALKPHOS 45 01/02/2020   AST 13 01/02/2020   ALT 10 01/02/2020   PROT 7.1 01/02/2020   ALBUMIN 4.3 01/02/2020   CALCIUM 9.3 01/02/2020    GFR 89.56 01/02/2020   Lab Results  Component Value Date   CHOL 154 07/18/2017   Lab Results  Component Value Date   HDL 77.70 07/18/2017   Lab Results  Component Value Date   LDLCALC 53 07/18/2017   Lab Results  Component Value Date   TRIG 115.0 07/18/2017   Lab Results  Component Value Date   CHOLHDL 2 07/18/2017   No  results found for: HGBA1C    Assessment & Plan:   Problem List Items Addressed This Visit      Unprioritized   PANIC DISORDER - Primary    Patient recently started on Prozac but has had frequent breakthrough anxiety symptoms.  We discussed titration of Prozac to 40 mg.  She already has several 20 mg tablets and will start taking 2 daily and give me some feedback in 2 to 3 weeks.  If not improved at that dosage consider change to a different medication.  She will continue with lorazepam as needed which she is taking very infrequently.  We discussed other measures to try to reduce general anxiety symptoms such as regular exercise and trying to get adequate sleep.  She will consider some melatonin for sleep  No orders of the defined types were placed in this encounter.   Follow-up: No follow-ups on file.    Evelena Peat, MD

## 2020-06-11 NOTE — Patient Instructions (Signed)
Increase the Prozac to 40 mg daily  Give me some feedback in a couple of weeks.

## 2020-06-14 ENCOUNTER — Emergency Department (HOSPITAL_COMMUNITY): Payer: Commercial Managed Care - PPO

## 2020-06-14 ENCOUNTER — Emergency Department (HOSPITAL_COMMUNITY)
Admission: EM | Admit: 2020-06-14 | Discharge: 2020-06-14 | Disposition: A | Discharge status: Home / Self Care | Payer: Commercial Managed Care - PPO | Attending: Emergency Medicine | Admitting: Emergency Medicine

## 2020-06-14 ENCOUNTER — Other Ambulatory Visit: Payer: Self-pay

## 2020-06-14 ENCOUNTER — Encounter (HOSPITAL_COMMUNITY): Payer: Self-pay | Admitting: Emergency Medicine

## 2020-06-14 DIAGNOSIS — M79602 Pain in left arm: Secondary | ICD-10-CM | POA: Diagnosis not present

## 2020-06-14 DIAGNOSIS — M25512 Pain in left shoulder: Secondary | ICD-10-CM | POA: Diagnosis not present

## 2020-06-14 DIAGNOSIS — Z5321 Procedure and treatment not carried out due to patient leaving prior to being seen by health care provider: Secondary | ICD-10-CM | POA: Insufficient documentation

## 2020-06-14 DIAGNOSIS — R079 Chest pain, unspecified: Secondary | ICD-10-CM | POA: Insufficient documentation

## 2020-06-14 LAB — BASIC METABOLIC PANEL
Anion gap: 13 (ref 5–15)
BUN: 9 mg/dL (ref 6–20)
CO2: 22 mmol/L (ref 22–32)
Calcium: 9.5 mg/dL (ref 8.9–10.3)
Chloride: 104 mmol/L (ref 98–111)
Creatinine, Ser: 1 mg/dL (ref 0.44–1.00)
GFR calc Af Amer: >60 mL/min (ref >60)
GFR calc non Af Amer: >60 mL/min (ref >60)
Glucose, Bld: 95 mg/dL (ref 70–99)
Potassium: 3.5 mmol/L (ref 3.5–5.1)
Sodium: 139 mmol/L (ref 135–145)

## 2020-06-14 LAB — CBC
HCT: 39 % (ref 36.0–46.0)
Hemoglobin: 12.5 g/dL (ref 12.0–15.0)
MCH: 30.3 pg (ref 26.0–34.0)
MCHC: 32.1 g/dL (ref 30.0–36.0)
MCV: 94.7 fL (ref 80.0–100.0)
Platelets: 388 10*3/uL (ref 150–400)
RBC: 4.12 MIL/uL (ref 3.87–5.11)
RDW: 12.3 % (ref 11.5–15.5)
WBC: 5.3 10*3/uL (ref 4.0–10.5)
nRBC: 0 % (ref 0.0–0.2)

## 2020-06-14 LAB — I-STAT BETA HCG BLOOD, ED (MC, WL, AP ONLY): I-stat hCG, quantitative: <5 m[IU]/mL (ref <5)

## 2020-06-14 LAB — TROPONIN I (HIGH SENSITIVITY): Troponin I (High Sensitivity): <2 ng/L (ref <18)

## 2020-06-14 NOTE — ED Notes (Signed)
Patient has left. °

## 2020-06-14 NOTE — ED Triage Notes (Signed)
Pt reports pressure to center of chest that radiates to L arm and L shoulder blade.  Denies sob, nausea, and vomiting.  Appears slightly anxious.  States her Prozac was just increased form 20mg  to 40mg .  States she was unsure if pain was related to indigestion or anxiety attack.  Denies pain at present but states it was 7/10 PTA.

## 2020-06-14 NOTE — ED Notes (Signed)
Pt LWBS. Pt stated she was feeling better and would look at her Mychart later. Pt stated she would call her PCP.

## 2020-08-25 ENCOUNTER — Telehealth: Payer: Self-pay | Admitting: Family Medicine

## 2020-08-25 NOTE — Telephone Encounter (Signed)
Patient made appt for wWednesday 08/27/20 on MyChart.  I returned her call to let her know She could make a sooner appointment with a different provider.  I left a VM on her answering machine to give [place] a call back.

## 2020-08-27 ENCOUNTER — Ambulatory Visit (INDEPENDENT_AMBULATORY_CARE_PROVIDER_SITE_OTHER): Payer: Commercial Managed Care - PPO | Admitting: Family Medicine

## 2020-08-27 ENCOUNTER — Encounter: Payer: Self-pay | Admitting: Family Medicine

## 2020-08-27 ENCOUNTER — Other Ambulatory Visit: Payer: Self-pay

## 2020-08-27 VITALS — Temp 98.4°F | Ht 65.0 in | Wt 153.6 lb

## 2020-08-27 DIAGNOSIS — F411 Generalized anxiety disorder: Secondary | ICD-10-CM

## 2020-08-27 MED ORDER — ESCITALOPRAM OXALATE 10 MG PO TABS: 10.0000 mg | ORAL_TABLET | Freq: Every day | ORAL | 5 refills | Status: DC

## 2020-08-27 NOTE — Patient Instructions (Signed)

## 2020-08-27 NOTE — Progress Notes (Signed)
Established Patient Office Visit  Subjective:  Patient ID: Frieda Arnall, female    DOB: [birth year]  Age: 34 y.o. MRN: [id number]  CC:  Chief Complaint  Patient presents with  . Dizziness    dizzniness with headaches x 1 week, comes and goes , not taking anything for this     HPI Rickeya Manus presents for discussion regarding her chronic anxiety.  She has history of panic disorder and possibly generalized anxiety as well.  She states she has daily fairly pervasive worries.  She states she frequently worries about dying.  She had been on sertraline for some time and for a long time that seem to work well for her.  This was recently transitioned to Prozac but she is not seeing good results with the Prozac.  She tried titrating this up to 40 mg but had side effects.  She would like to explore other options.  She denies any depression currently.  She does not recall any other medications in the past other than the Prozac and sertraline.  She has used infrequent lorazepam for severe symptoms.  She had multiple physical symptoms which she thinks are anxiety related including intermittent chest pressure, head pressure, dizziness, anxiousness, forgetfulness, intermittent paresthesias.  Symptoms seem to be worse with anxious periods  Past Medical History:  Diagnosis Date  . ANEMIA-IRON DEFICIENCY 10/23/2009  . DEPRESSION 07/08/2010  . High risk HPV infection   . PANIC DISORDER 10/23/2009    Past Surgical History:  Procedure Laterality Date  . CESAREAN SECTION  11/19/2011   Procedure: CESAREAN SECTION;  Surgeon: Serita Kyle, MD;  Location: [facility];  Service: Gynecology;  Laterality: N/A;  Primary  . MOUTH SURGERY    . WISDOM TOOTH EXTRACTION      Family History  Problem Relation Age of Onset  . Arthritis Mother   . Hypertension Maternal Grandmother   . Arthritis Maternal Grandmother   . Hypertension Maternal Grandfather   . Arthritis Maternal Grandfather   . Colon cancer Maternal  Grandfather   . Hypertension Paternal Grandmother   . Hypertension Paternal Grandfather     Social History   Socioeconomic History  . Marital status: Single    Spouse name: Not on file  . Number of children: 1  . Years of education: Not on file  . Highest education level: Not on file  Occupational History  . Not on file  Tobacco Use  . Smoking status: Never Smoker  . Smokeless tobacco: Never Used  Vaping Use  . Vaping Use: Former  Substance and Sexual Activity  . Alcohol use: Yes    Alcohol/week: 0.0 standard drinks    Comment: OCC  . Drug use: No  . Sexual activity: Yes    Partners: Male  Other Topics Concern  . Not on file  Social History Narrative  . Not on file   Social Determinants of Health   Financial Resource Strain:   . Difficulty of Paying Living Expenses: Not on file  Food Insecurity:   . Worried About Programme researcher, broadcasting/film/video in the Last Year: Not on file  . Ran Out of Food in the Last Year: Not on file  Transportation Needs:   . Lack of Transportation (Medical): Not on file  . Lack of Transportation (Non-Medical): Not on file  Physical Activity:   . Days of Exercise per Week: Not on file  . Minutes of Exercise per Session: Not on file  Stress:   . Feeling of Stress :  Not on file  Social Connections:   . Frequency of Communication with Friends and Family: Not on file  . Frequency of Social Gatherings with Friends and Family: Not on file  . Attends Religious Services: Not on file  . Active Member of Clubs or Organizations: Not on file  . Attends Banker Meetings: Not on file  . Marital Status: Not on file  Intimate Partner Violence:   . Fear of Current or Ex-Partner: Not on file  . Emotionally Abused: Not on file  . Physically Abused: Not on file  . Sexually Abused: Not on file    Outpatient Medications Prior to Visit  Medication Sig Dispense Refill  . LORazepam (ATIVAN) 0.5 MG tablet Take 1 tablet (0.5 mg total) by mouth every 8  (eight) hours as needed for anxiety. 30 tablet 0  . norethindrone-ethinyl estradiol (JUNEL FE,GILDESS FE,LOESTRIN FE) 1-20 MG-MCG tablet Take 1 tablet by mouth daily. Per OB/GYN    . FLUoxetine (PROZAC) 20 MG tablet Take 1 tablet (20 mg total) by mouth daily. (Patient taking differently: Take 40 mg by mouth daily. Take 2 tablets once daily) 30 tablet 3   No facility-administered medications prior to visit.    Allergies  Allergen Reactions  . Codeine Itching    ROS Review of Systems  Constitutional: Positive for fatigue.  Respiratory: Negative for cough and shortness of breath.   Cardiovascular: Positive for chest pain.  Gastrointestinal: Negative for abdominal pain.  Psychiatric/Behavioral: The patient is nervous/anxious.       Objective:    Physical Exam Vitals reviewed.  Constitutional:      Appearance: Normal appearance.  Eyes:     Pupils: Pupils are equal, round, and reactive to light.  Cardiovascular:     Rate and Rhythm: Normal rate and regular rhythm.  Pulmonary:     Effort: Pulmonary effort is normal.     Breath sounds: Normal breath sounds.  Musculoskeletal:     Cervical back: Neck supple.     Right lower leg: No edema.     Left lower leg: No edema.  Lymphadenopathy:     Cervical: No cervical adenopathy.  Neurological:     General: No focal deficit present.     Mental Status: She is alert.     Cranial Nerves: No cranial nerve deficit.     Temp 98.4 F (36.9 C) (Oral)   Ht 5\' 5"  (1.651 m)   Wt 153 lb 9.6 oz (69.7 kg)   SpO2 97%   BMI 25.56 kg/m  Wt Readings from Last 3 Encounters:  08/27/20 153 lb 9.6 oz (69.7 kg)  06/11/20 163 lb 12.8 oz (74.3 kg)  04/08/20 168 lb 3.2 oz (76.3 kg)     Health Maintenance Due  Topic Date Due  . Hepatitis C Screening  Never done  . COVID-19 Vaccine (1) Never done  . MAMMOGRAM  Never done  . PAP SMEAR-Modifier  06/26/2011    There are no preventive care reminders to display for this patient.  Lab Results    Component Value Date   TSH 1.41 01/02/2020   Lab Results  Component Value Date   WBC 5.3 06/14/2020   HGB 12.5 06/14/2020   HCT 39.0 06/14/2020   MCV 94.7 06/14/2020   PLT 388 06/14/2020   Lab Results  Component Value Date   NA 139 06/14/2020   K 3.5 06/14/2020   CO2 22 06/14/2020   GLUCOSE 95 06/14/2020   BUN 9 06/14/2020   CREATININE 1.00  06/14/2020   BILITOT 0.6 01/02/2020   ALKPHOS 45 01/02/2020   AST 13 01/02/2020   ALT 10 01/02/2020   PROT 7.1 01/02/2020   ALBUMIN 4.3 01/02/2020   CALCIUM 9.5 06/14/2020   ANIONGAP 13 06/14/2020   GFR 89.56 01/02/2020   Lab Results  Component Value Date   CHOL 154 07/18/2017   Lab Results  Component Value Date   HDL 77.70 07/18/2017   Lab Results  Component Value Date   LDLCALC 53 07/18/2017   Lab Results  Component Value Date   TRIG 115.0 07/18/2017   Lab Results  Component Value Date   CHOLHDL 2 07/18/2017   No results found for: HGBA1C    Assessment & Plan:   Aubreyanna relates multiple physical somatic complaints and daily pervasive anxiety symptoms.  She has pervasive worries.  Question generalized anxiety disorder.  She has not responded well with Prozac. -Discontinue Prozac and start Lexapro 10 mg once daily -We have asked that she give [place] some feedback in a few weeks to see how this is going. -We also discussed possible cognitive behavioral therapy to help with her anxiety management and she was given brochure for our behavioral health division to set up follow-up.  Meds ordered this encounter  Medications  . escitalopram (LEXAPRO) 10 MG tablet    Sig: Take 1 tablet (10 mg total) by mouth daily.    Dispense:  30 tablet    Refill:  5    Follow-up: No follow-ups on file.    Evelena Peat, MD

## 2021-02-16 ENCOUNTER — Telehealth: Payer: Self-pay | Admitting: Family Medicine

## 2021-02-16 NOTE — Telephone Encounter (Signed)
Time for pt to schedule a physical anytime.

## 2021-02-18 ENCOUNTER — Other Ambulatory Visit: Payer: Self-pay | Admitting: Family Medicine

## 2021-03-19 ENCOUNTER — Other Ambulatory Visit: Payer: Self-pay | Admitting: Family Medicine

## 2021-03-25 ENCOUNTER — Ambulatory Visit (INDEPENDENT_AMBULATORY_CARE_PROVIDER_SITE_OTHER): Payer: Commercial Managed Care - PPO | Admitting: Family Medicine

## 2021-03-25 ENCOUNTER — Other Ambulatory Visit: Payer: Self-pay

## 2021-03-25 ENCOUNTER — Encounter: Payer: Self-pay | Admitting: Family Medicine

## 2021-03-25 VITALS — BP 120/62 | HR 83 | Temp 98.3°F | Ht 65.0 in | Wt 153.2 lb

## 2021-03-25 DIAGNOSIS — F410 Panic disorder [episodic paroxysmal anxiety] without agoraphobia: Secondary | ICD-10-CM

## 2021-03-25 MED ORDER — ESCITALOPRAM OXALATE 10 MG PO TABS
1.0000 | ORAL_TABLET | Freq: Every day | ORAL | 3 refills | Status: DC
Start: 2021-03-25 — End: 2022-03-15

## 2021-03-25 NOTE — Progress Notes (Signed)
Established Patient Office Visit  Subjective:  Patient ID: Kristin Parks, female    DOB: 04-Jan-1986  Age: 35 y.o. MRN: [id number]  CC:  Chief Complaint  Patient presents with  . Annual Exam    No new concerns     HPI Bernardine Langworthy presents for medical follow-up.  She was actually told she need to come in for physical.  However, she just saw her GYN last Friday and had wellness visit with Pap smear and mammogram.  She really does not need a physical at this time.  She declines a physical today.  She does have chronic history of anxiety for years.  She been on Prozac for years but felt like she was having some breakthrough anxiety symptoms.  We transitioned her to Lexapro 10 mg daily and she feels like this is working very well to control her anxiety symptoms.  She is also learn some behavioral techniques to help as well.  She is very pleased with medication at this time and needs refills.  Does not take lorazepam except for very infrequent use.  Past Medical History:  Diagnosis Date  . ANEMIA-IRON DEFICIENCY 10/23/2009  . DEPRESSION 07/08/2010  . High risk HPV infection   . PANIC DISORDER 10/23/2009    Past Surgical History:  Procedure Laterality Date  . CESAREAN SECTION  11/19/2011   Procedure: CESAREAN SECTION;  Surgeon: Serita Kyle, MD;  Location: [facility];  Service: Gynecology;  Laterality: N/A;  Primary  . MOUTH SURGERY    . WISDOM TOOTH EXTRACTION      Family History  Problem Relation Age of Onset  . Arthritis Mother   . Hypertension Maternal Grandmother   . Arthritis Maternal Grandmother   . Hypertension Maternal Grandfather   . Arthritis Maternal Grandfather   . Colon cancer Maternal Grandfather   . Hypertension Paternal Grandmother   . Hypertension Paternal Grandfather     Social History   Socioeconomic History  . Marital status: Single    Spouse name: Not on file  . Number of children: 1  . Years of education: Not on file  . Highest education level: Not on  file  Occupational History  . Not on file  Tobacco Use  . Smoking status: Never Smoker  . Smokeless tobacco: Never Used  Vaping Use  . Vaping Use: Former  Substance and Sexual Activity  . Alcohol use: Yes    Alcohol/week: 0.0 standard drinks    Comment: OCC  . Drug use: No  . Sexual activity: Yes    Partners: Male  Other Topics Concern  . Not on file  Social History Narrative  . Not on file   Social Determinants of Health   Financial Resource Strain: Not on file  Food Insecurity: Not on file  Transportation Needs: Not on file  Physical Activity: Not on file  Stress: Not on file  Social Connections: Not on file  Intimate Partner Violence: Not on file    Outpatient Medications Prior to Visit  Medication Sig Dispense Refill  . LORazepam (ATIVAN) 0.5 MG tablet Take 1 tablet (0.5 mg total) by mouth every 8 (eight) hours as needed for anxiety. 30 tablet 0  . norethindrone-ethinyl estradiol (JUNEL FE,GILDESS FE,LOESTRIN FE) 1-20 MG-MCG tablet Take 1 tablet by mouth daily. Per OB/GYN    . escitalopram (LEXAPRO) 10 MG tablet TAKE 1 TABLET BY MOUTH EVERY DAY 30 tablet 0   No facility-administered medications prior to visit.    Allergies  Allergen Reactions  .  Codeine Itching    ROS Review of Systems  Constitutional: Negative for appetite change and unexpected weight change.  Psychiatric/Behavioral: Negative for agitation, confusion and dysphoric mood.      Objective:    Physical Exam Vitals reviewed.  Constitutional:      Appearance: Normal appearance.  Cardiovascular:     Rate and Rhythm: Normal rate and regular rhythm.     Heart sounds: No murmur heard.   Pulmonary:     Effort: Pulmonary effort is normal.     Breath sounds: Normal breath sounds.  Neurological:     Mental Status: She is alert.  Psychiatric:        Mood and Affect: Mood normal.        Thought Content: Thought content normal.     BP 120/62 (BP Location: Left Arm, Patient Position:  Sitting, Cuff Size: Normal)   Pulse 83   Temp 98.3 F (36.8 C) (Oral)   Ht 5\' 5"  (1.651 m)   Wt 153 lb 3.2 oz (69.5 kg)   SpO2 99%   BMI 25.49 kg/m  Wt Readings from Last 3 Encounters:  03/25/21 153 lb 3.2 oz (69.5 kg)  08/27/20 153 lb 9.6 oz (69.7 kg)  06/11/20 163 lb 12.8 oz (74.3 kg)     Health Maintenance Due  Topic Date Due  . COVID-19 Vaccine (1) Never done  . Hepatitis C Screening  Never done    There are no preventive care reminders to display for this patient.  Lab Results  Component Value Date   TSH 1.41 01/02/2020   Lab Results  Component Value Date   WBC 5.3 06/14/2020   HGB 12.5 06/14/2020   HCT 39.0 06/14/2020   MCV 94.7 06/14/2020   PLT 388 06/14/2020   Lab Results  Component Value Date   NA 139 06/14/2020   K 3.5 06/14/2020   CO2 22 06/14/2020   GLUCOSE 95 06/14/2020   BUN 9 06/14/2020   CREATININE 1.00 06/14/2020   BILITOT 0.6 01/02/2020   ALKPHOS 45 01/02/2020   AST 13 01/02/2020   ALT 10 01/02/2020   PROT 7.1 01/02/2020   ALBUMIN 4.3 01/02/2020   CALCIUM 9.5 06/14/2020   ANIONGAP 13 06/14/2020   GFR 89.56 01/02/2020   Lab Results  Component Value Date   CHOL 154 07/18/2017   Lab Results  Component Value Date   HDL 77.70 07/18/2017   Lab Results  Component Value Date   LDLCALC 53 07/18/2017   Lab Results  Component Value Date   TRIG 115.0 07/18/2017   Lab Results  Component Value Date   CHOLHDL 2 07/18/2017   No results found for: HGBA1C    Assessment & Plan:   Chronic anxiety symptoms with history of panic disorder improved on Lexapro  -Refill Lexapro for 1 year -No need for any labs at this time -We will plan 1 year follow-up unless indicated sooner  Meds ordered this encounter  Medications  . escitalopram (LEXAPRO) 10 MG tablet    Sig: Take 1 tablet (10 mg total) by mouth daily.    Dispense:  90 tablet    Refill:  3    Patient needs a physical for further refills.    Follow-up: Return in about 1 year  (around 03/25/2022).    05/25/2022, MD

## 2021-08-19 ENCOUNTER — Other Ambulatory Visit: Payer: Self-pay

## 2021-08-19 ENCOUNTER — Ambulatory Visit (INDEPENDENT_AMBULATORY_CARE_PROVIDER_SITE_OTHER): Payer: BC Managed Care – PPO | Admitting: Family Medicine

## 2021-08-19 VITALS — BP 130/80 | HR 78 | Temp 98.2°F | Wt 154.0 lb

## 2021-08-19 DIAGNOSIS — R519 Headache, unspecified: Secondary | ICD-10-CM

## 2021-08-19 MED ORDER — ELETRIPTAN HYDROBROMIDE 40 MG PO TABS: 40.0000 mg | ORAL_TABLET | ORAL | 1 refills | Status: DC | PRN

## 2021-08-19 NOTE — Progress Notes (Signed)
Established Patient Office Visit  Subjective:  Patient ID: Kristin Parks, female    DOB: 1986-03-21  Age: 35 y.o. MRN: [id number]  CC:  Chief Complaint  Patient presents with   Headache    X 1 month, last for 3 days at a time, using otc meds which do help to lessen the headache but never goes away.     HPI Kristin Parks presents for new onset headaches over the past month.  She states these are consistently occurring left occipital area.  Usually has headache about every 3 days.  This can last several hours to up to 2-day duration.  No recent head injury.  No prior history of migraine headaches.  She describes pulsating to throbbing headache with some associated nausea but no vomiting.  Occasional light sensitivity.  Seems to be worse with activity.  Has taken some ibuprofen with minimal relief.  Last headache was this past Sunday but again occurring about every few days.  No clear triggering factors.  No family history of migraine headaches.  Denies any recent appetite or weight changes.  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   DEPRESSION 07/08/2010   High risk HPV infection    PANIC DISORDER 10/23/2009    Past Surgical History:  Procedure Laterality Date   CESAREAN SECTION  11/19/2011   Procedure: CESAREAN SECTION;  Surgeon: Serita Kyle, MD;  Location: [facility];  Service: Gynecology;  Laterality: N/A;  Primary   MOUTH SURGERY     WISDOM TOOTH EXTRACTION      Family History  Problem Relation Age of Onset   Arthritis Mother    Hypertension Maternal Grandmother    Arthritis Maternal Grandmother    Hypertension Maternal Grandfather    Arthritis Maternal Grandfather    Colon cancer Maternal Grandfather    Hypertension Paternal Grandmother    Hypertension Paternal Grandfather     Social History   Socioeconomic History   Marital status: Single     Spouse name: Not on file   Number of children: 1   Years of education: Not on file   Highest education level: Not on file  Occupational History   Not on file  Tobacco Use   Smoking status: Never   Smokeless tobacco: Never  Vaping Use   Vaping Use: Former  Substance and Sexual Activity   Alcohol use: Yes    Alcohol/week: 0.0 standard drinks    Comment: OCC   Drug use: No   Sexual activity: Yes    Partners: Male  Other Topics Concern   Not on file  Social History Narrative   Not on file   Social Determinants of Health   Financial Resource Strain: Not on file  Food Insecurity: Not on file  Transportation Needs: Not on file  Physical Activity: Not on file  Stress: Not on file  Social Connections: Not on file  Intimate Partner Violence: Not on file    Outpatient Medications Prior to Visit  Medication Sig Dispense Refill   escitalopram (LEXAPRO) 10 MG tablet Take 1 tablet (10 mg total) by mouth daily. 90 tablet 3   LORazepam (ATIVAN) 0.5 MG tablet Take 1 tablet (0.5 mg total) by mouth every 8 (eight) hours as needed for anxiety. 30 tablet 0   norethindrone-ethinyl estradiol (JUNEL FE,GILDESS FE,LOESTRIN FE) 1-20 MG-MCG tablet Take 1 tablet by mouth daily. Per OB/GYN     No  facility-administered medications prior to visit.    Allergies  Allergen Reactions   Codeine Itching    ROS Review of Systems  Constitutional:  Negative for chills, fever and unexpected weight change.  HENT:  Negative for sinus pressure, sinus pain and sore throat.   Eyes:  Negative for visual disturbance.  Cardiovascular:  Negative for chest pain.  Neurological:  Positive for headaches. Negative for dizziness, tremors, seizures, syncope, facial asymmetry, speech difficulty and weakness.  Psychiatric/Behavioral:  Negative for confusion.      Objective:    Physical Exam Vitals reviewed.  Constitutional:      Appearance: She is well-developed.  Cardiovascular:     Rate and Rhythm: Normal  rate and regular rhythm.  Pulmonary:     Effort: Pulmonary effort is normal.     Breath sounds: Normal breath sounds.  Musculoskeletal:     Cervical back: Neck supple. No rigidity.  Lymphadenopathy:     Cervical: No cervical adenopathy.  Neurological:     Mental Status: She is alert and oriented to person, place, and time.     Cranial Nerves: No cranial nerve deficit or facial asymmetry.     Motor: No weakness.     Coordination: Coordination normal.     Gait: Gait normal.  Psychiatric:        Mood and Affect: Mood normal.    BP 130/80 (BP Location: Left Arm, Patient Position: Sitting, Cuff Size: Normal)   Pulse 78   Temp 98.2 F (36.8 C) (Oral)   Wt 154 lb (69.9 kg)   LMP 08/05/2021 (Approximate)   SpO2 98%   BMI 25.63 kg/m  Wt Readings from Last 3 Encounters:  08/19/21 154 lb (69.9 kg)  03/25/21 153 lb 3.2 oz (69.5 kg)  08/27/20 153 lb 9.6 oz (69.7 kg)     Health Maintenance Due  Topic Date Due   COVID-19 Vaccine (1) Never done   Hepatitis C Screening  Never done   INFLUENZA VACCINE  05/25/2021    There are no preventive care reminders to display for this patient.  Lab Results  Component Value Date   TSH 1.41 01/02/2020   Lab Results  Component Value Date   WBC 5.3 06/14/2020   HGB 12.5 06/14/2020   HCT 39.0 06/14/2020   MCV 94.7 06/14/2020   PLT 388 06/14/2020   Lab Results  Component Value Date   NA 139 06/14/2020   K 3.5 06/14/2020   CO2 22 06/14/2020   GLUCOSE 95 06/14/2020   BUN 9 06/14/2020   CREATININE 1.00 06/14/2020   BILITOT 0.6 01/02/2020   ALKPHOS 45 01/02/2020   AST 13 01/02/2020   ALT 10 01/02/2020   PROT 7.1 01/02/2020   ALBUMIN 4.3 01/02/2020   CALCIUM 9.5 06/14/2020   ANIONGAP 13 06/14/2020   GFR 89.56 01/02/2020   Lab Results  Component Value Date   CHOL 154 07/18/2017   Lab Results  Component Value Date   HDL 77.70 07/18/2017   Lab Results  Component Value Date   LDLCALC 53 07/18/2017   Lab Results  Component  Value Date   TRIG 115.0 07/18/2017   Lab Results  Component Value Date   CHOLHDL 2 07/18/2017   No results found for: HGBA1C    Assessment & Plan:   Patient presents with new onset headache for the past month occurring frequently over that time span and unilateral and consistently in the left occipital region.  She does have some components of possible vascular headache such  as pulsating/throbbing quality, nausea, light sensitivity but this is occurring frequently every few days and is new onset.  Nonfocal neuro exam at this time.  -We did discuss possible trial of Relpax 40 mg 1 at onset of headache and 1 to 2 hours later but no more than 2 in 24 hours -Given new onset headache and somewhat atypical issue of frequency (q 3 days) as above would consider further neuroimaging  Meds ordered this encounter  Medications   eletriptan (RELPAX) 40 MG tablet    Sig: Take 1 tablet (40 mg total) by mouth as needed for migraine or headache. May repeat in 2 hours if headache persists or recurs.    Dispense:  10 tablet    Refill:  1    Follow-up: No follow-ups on file.    Evelena Peat, MD

## 2021-09-08 ENCOUNTER — Other Ambulatory Visit: Payer: Self-pay

## 2021-09-08 ENCOUNTER — Ambulatory Visit
Admission: RE | Admit: 2021-09-08 | Discharge: 2021-09-08 | Disposition: A | Discharge status: Home / Self Care | Payer: BC Managed Care – PPO | Source: Ambulatory Visit | Attending: Family Medicine | Admitting: Family Medicine

## 2021-09-08 DIAGNOSIS — R519 Headache, unspecified: Secondary | ICD-10-CM

## 2022-02-02 DIAGNOSIS — E669 Obesity, unspecified: Secondary | ICD-10-CM | POA: Diagnosis not present

## 2022-02-02 DIAGNOSIS — R635 Abnormal weight gain: Secondary | ICD-10-CM | POA: Diagnosis not present

## 2022-02-02 DIAGNOSIS — M2550 Pain in unspecified joint: Secondary | ICD-10-CM | POA: Diagnosis not present

## 2022-02-02 DIAGNOSIS — Z713 Dietary counseling and surveillance: Secondary | ICD-10-CM | POA: Diagnosis not present

## 2022-02-02 DIAGNOSIS — Z3202 Encounter for pregnancy test, result negative: Secondary | ICD-10-CM | POA: Diagnosis not present

## 2022-02-02 DIAGNOSIS — R5383 Other fatigue: Secondary | ICD-10-CM | POA: Diagnosis not present

## 2022-02-09 DIAGNOSIS — M2550 Pain in unspecified joint: Secondary | ICD-10-CM | POA: Diagnosis not present

## 2022-02-09 DIAGNOSIS — E639 Nutritional deficiency, unspecified: Secondary | ICD-10-CM | POA: Diagnosis not present

## 2022-02-09 DIAGNOSIS — F419 Anxiety disorder, unspecified: Secondary | ICD-10-CM | POA: Diagnosis not present

## 2022-02-09 DIAGNOSIS — Z713 Dietary counseling and surveillance: Secondary | ICD-10-CM | POA: Diagnosis not present

## 2022-02-09 DIAGNOSIS — E669 Obesity, unspecified: Secondary | ICD-10-CM | POA: Diagnosis not present

## 2022-03-14 ENCOUNTER — Other Ambulatory Visit: Payer: Self-pay | Admitting: Family Medicine

## 2022-03-14 DIAGNOSIS — F411 Generalized anxiety disorder: Secondary | ICD-10-CM

## 2022-06-14 ENCOUNTER — Other Ambulatory Visit: Payer: Self-pay | Admitting: Family Medicine

## 2022-06-14 DIAGNOSIS — F411 Generalized anxiety disorder: Secondary | ICD-10-CM

## 2022-07-15 ENCOUNTER — Other Ambulatory Visit: Payer: Self-pay | Admitting: Family Medicine

## 2022-07-15 DIAGNOSIS — F411 Generalized anxiety disorder: Secondary | ICD-10-CM

## 2022-07-16 MED ORDER — ESCITALOPRAM OXALATE 10 MG PO TABS: 10.0000 mg | ORAL_TABLET | Freq: Every day | ORAL | 0 refills | Status: DC

## 2022-07-16 NOTE — Addendum Note (Signed)
Addended by: Nilda Riggs on: 07/16/2022 03:23 PM   Modules accepted: Orders

## 2022-07-16 NOTE — Telephone Encounter (Signed)
Pt called to FU on the refill request for the  escitalopram (LEXAPRO) 10 MG tablet  LOV:  08/19/21  Pt was scheduled for an OV on 07/21/2022  Please advise.  New Pharmacy: [facility] [place], [place] AT [place] Phone:  [phone]  Fax:  [phone]

## 2022-07-21 ENCOUNTER — Ambulatory Visit: Payer: BC Managed Care – PPO | Admitting: Family Medicine

## 2022-07-28 ENCOUNTER — Ambulatory Visit (INDEPENDENT_AMBULATORY_CARE_PROVIDER_SITE_OTHER): Payer: Commercial Managed Care - PPO | Admitting: Family Medicine

## 2022-07-28 VITALS — BP 114/80 | HR 90 | Temp 98.0°F | Ht 65.0 in | Wt 143.8 lb

## 2022-07-28 DIAGNOSIS — F410 Panic disorder [episodic paroxysmal anxiety] without agoraphobia: Secondary | ICD-10-CM | POA: Diagnosis not present

## 2022-07-28 DIAGNOSIS — F411 Generalized anxiety disorder: Secondary | ICD-10-CM | POA: Diagnosis not present

## 2022-07-28 MED ORDER — ESCITALOPRAM OXALATE 10 MG PO TABS: 10.0000 mg | ORAL_TABLET | Freq: Every day | ORAL | 3 refills | Status: DC

## 2022-07-28 NOTE — Progress Notes (Signed)
   Established Patient Office Visit  Subjective   Patient ID: Kristin Parks, female    DOB: 1986/10/16  Age: 36 y.o. MRN: [id number]  Chief Complaint  Patient presents with   Medication Refill    HPI   Kristin Parks is seen for medical follow-up for medication refill.  She has been on SSRIs for several years for history of anxiety symptoms with panic disorder.  She has had history of depression in the past as well.  She and her daughter recently moved to [place].  That is going well.  Her daughter is fifth grade and attends [employer] school there.  Kristin Parks has no concerns with medication.  She is on Lexapro 10 mg daily and that seems to be controlling anxiety symptoms fairly well.  She does see gynecologist regularly.  She is on birth control and takes no other regular medications.  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 She has never used smokeless tobacco. She reports current alcohol use. She reports that she does not use drugs. family history includes Arthritis in her maternal grandfather, maternal grandmother, and mother; Colon cancer in her maternal grandfather; Hypertension in her maternal grandfather, maternal grandmother, paternal grandfather, and paternal grandmother. Allergies  Allergen Reactions   Codeine Itching    Review of Systems  Constitutional:  Negative for malaise/fatigue.  Cardiovascular:  Negative for chest pain.  Neurological:  Negative for dizziness.  Psychiatric/Behavioral:  Negative for depression. The patient is not nervous/anxious.        Objective:     BP 114/80 (BP Location: Left Arm, Patient Position: Sitting, Cuff Size: Normal)   Pulse 90   Temp 98 F (36.7 C) (Oral)   Ht 5\' 5"  (1.651 m)   Wt 143 lb 12.8 oz (65.2 kg)   SpO2 99%   BMI 23.93 kg/m    Physical Exam Vitals reviewed.  Constitutional:      Appearance: Normal appearance.  Cardiovascular:     Rate and Rhythm: Normal rate and regular rhythm.     Heart sounds: No murmur heard. Pulmonary:     Effort: Pulmonary effort is normal.     Breath sounds: Normal breath sounds. No rales.  Musculoskeletal:     Cervical back: Neck supple.  Lymphadenopathy:     Cervical: No cervical adenopathy.  Neurological:     Mental Status: She is alert.  Psychiatric:        Mood and Affect: Mood normal.      No results found for any visits on 07/28/22.    The ASCVD Risk score (Arnett DK, et al., 2019) failed to calculate for the following reasons:   The 2019 ASCVD risk score is only valid for ages 51 to 67    Assessment & Plan:   #1 history of chronic anxiety with history of panic disorder.  Currently stable on Lexapro 10 mg daily.  Refill for 1 year. Recommend yearly follow-up unless indicated sooner   No follow-ups on file.    Kristin Littler, MD

## 2022-09-11 ENCOUNTER — Other Ambulatory Visit: Payer: Self-pay | Admitting: Family Medicine

## 2022-09-11 DIAGNOSIS — F411 Generalized anxiety disorder: Secondary | ICD-10-CM

## 2023-07-06 ENCOUNTER — Telehealth: Payer: Self-pay | Admitting: Family Medicine

## 2023-07-07 ENCOUNTER — Telehealth: Payer: Self-pay | Admitting: Family Medicine

## 2023-07-07 DIAGNOSIS — F411 Generalized anxiety disorder: Secondary | ICD-10-CM

## 2023-07-07 MED ORDER — ESCITALOPRAM OXALATE 10 MG PO TABS: 10.0000 mg | ORAL_TABLET | Freq: Every day | ORAL | 0 refills | Status: DC

## 2023-07-07 NOTE — Addendum Note (Signed)
Addended by: Johnella Moloney on: 07/07/2023 04:43 PM   Modules accepted: Orders

## 2023-07-07 NOTE — Telephone Encounter (Signed)
Rx done. 

## 2023-07-07 NOTE — Telephone Encounter (Signed)
Prescription Request  07/07/2023  LOV: 07/28/2022  What is the name of the medication or equipment? Lexapro 10mg . Pt states she only has 2 pills left. She has an appointment with Dr. Caryl Never on 9/25 and just needs a refill to make it to that appointment.   Have you contacted your pharmacy to request a refill? No   Which pharmacy would you like this sent to? [id number] Vilinda Boehringer, [place] - [address] AT [facility] OF [facility] & [address] [place] [address] Phone: [phone] Fax: [phone]   Patient notified that their request is being sent to the clinical staff for review and that they should receive a response within 2 business days.   Please advise at Mobile [phone] (mobile)

## 2023-07-08 ENCOUNTER — Telehealth: Payer: Self-pay | Admitting: Family Medicine

## 2023-07-10 IMAGING — MR MR MRA HEAD W/O CM
1 series · 23 of 48 positions shown · non-contrast
Comparison: None.

CLINICAL DATA: Headache, new or worsening, positional (Age 19-49y)

EXAM:
MRI HEAD WITHOUT CONTRAST
MRA HEAD WITHOUT CONTRAST
TECHNIQUE: Multiplanar, multiecho pulse sequences of the brain and surrounding
structures were obtained without intravenous contrast. Angiographic
images of the Circle of Willis were obtained using MRA technique
without intravenous contrast.

[Series 2: [id number]_multi-slab · axial · 0.7mm · 0.35mm/px · z∈[-11,+79]mm · 23 of 136 slices shown]
[im 1/136]
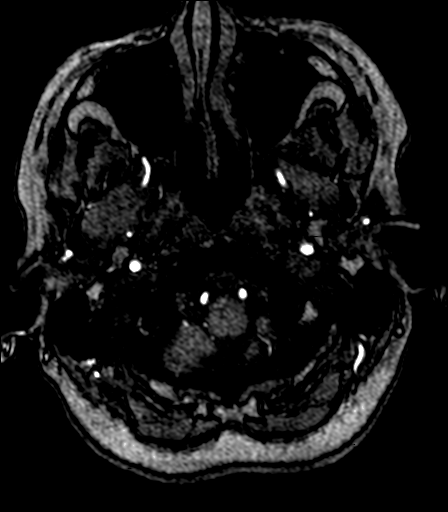
[im 3/136]
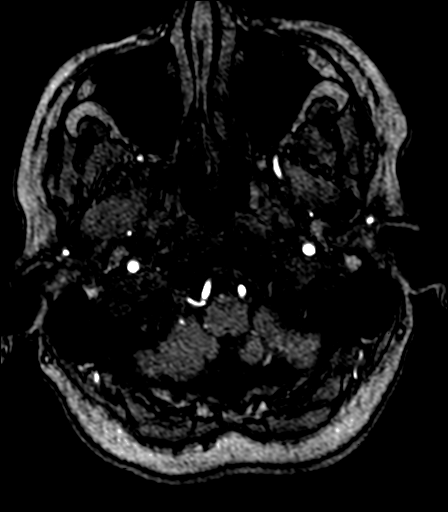
[im 6/136]
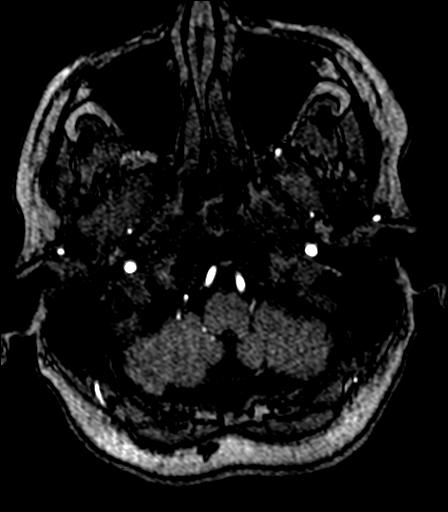
[im 9/136]
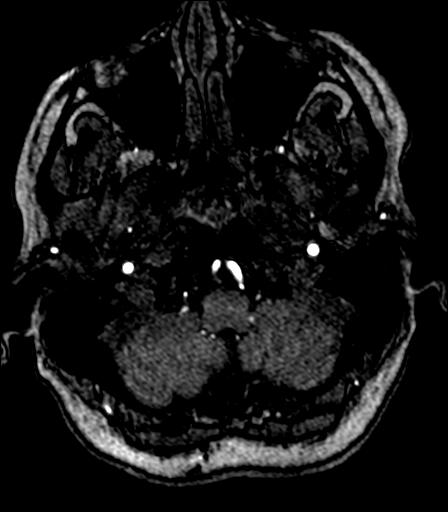
[im 12/136]
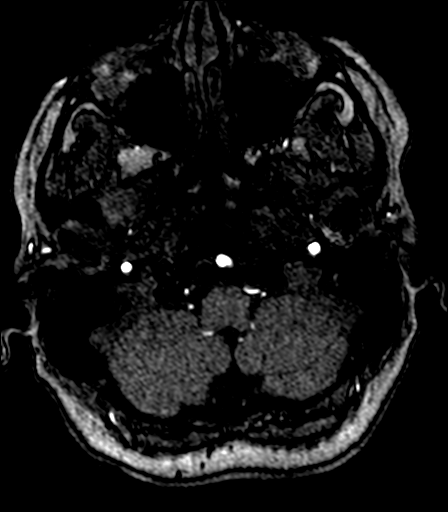
[im 15/136]
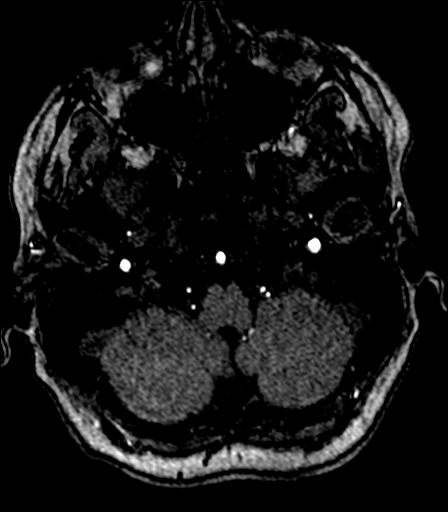
[im 18/136]
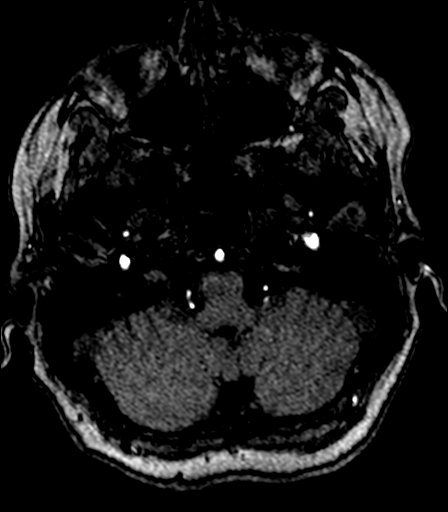
[im 21/136]
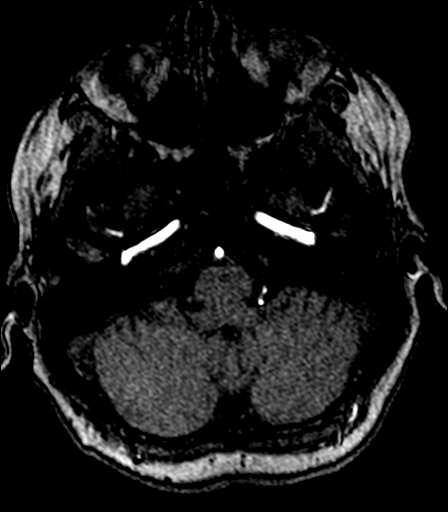
[im 23/136]
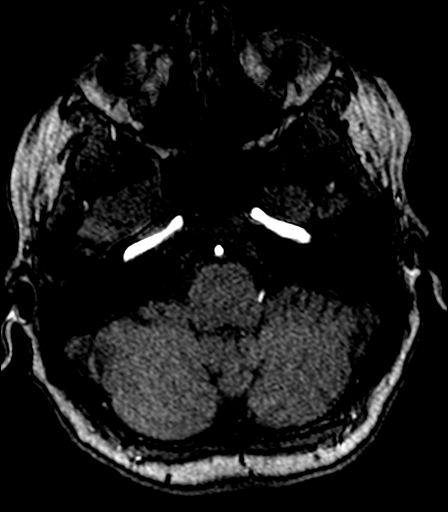
[im 26/136]
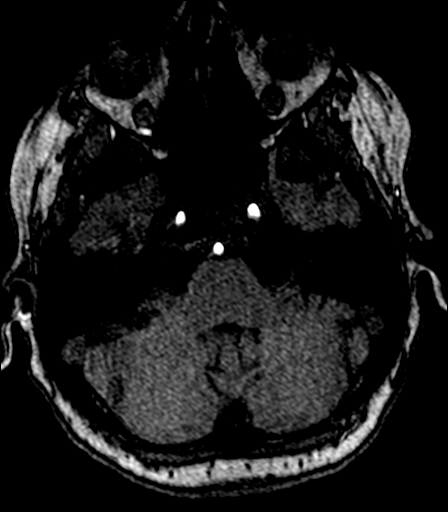
[im 29/136]
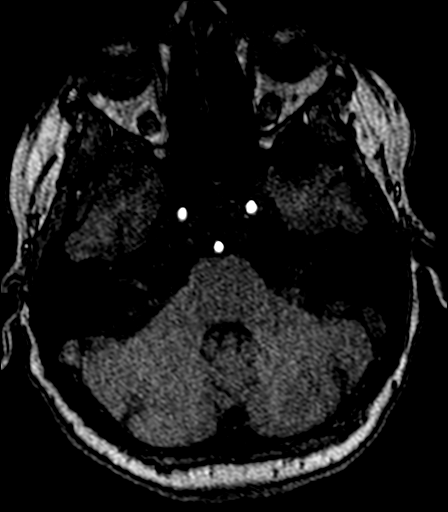
[im 32/136]
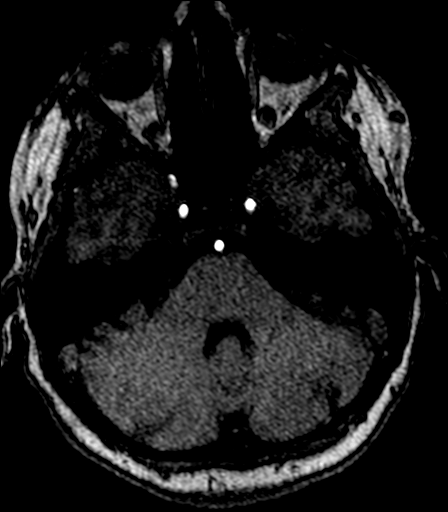
[im 35/136]
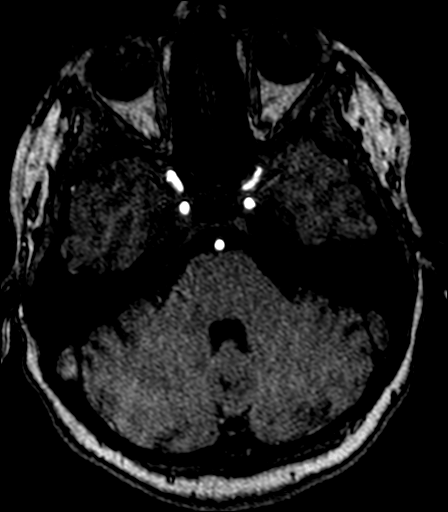
[im 38/136]
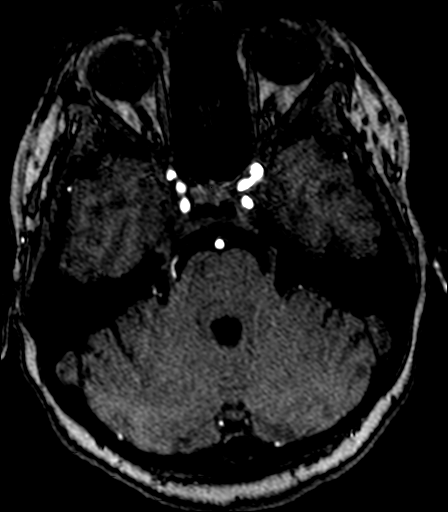
[im 41/136]
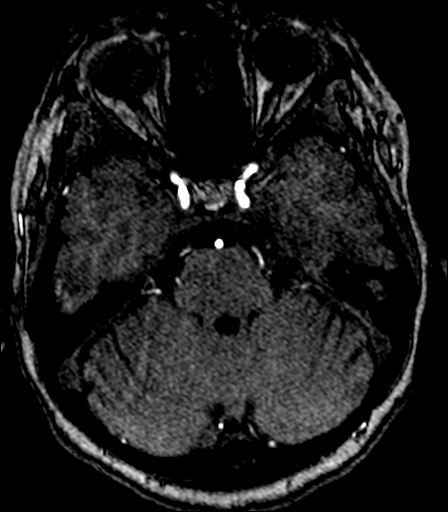
[im 44/136]
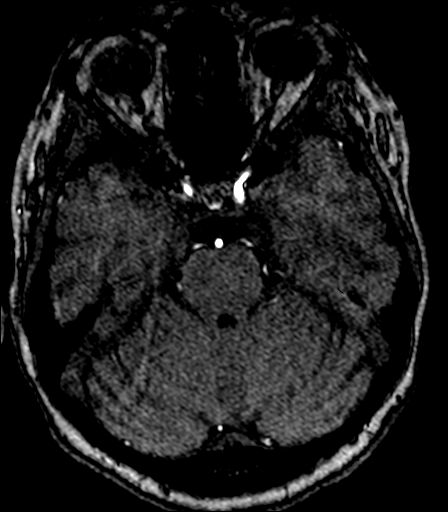
[im 61/136]
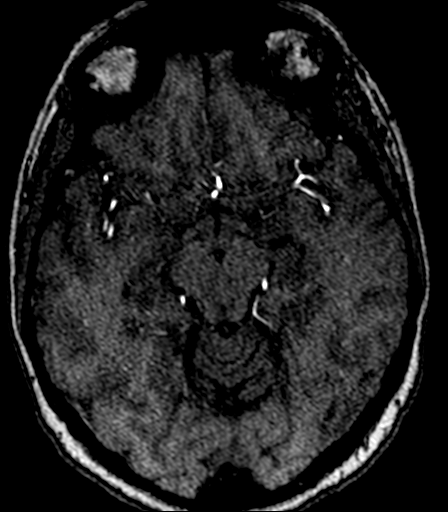
[im 69/136]
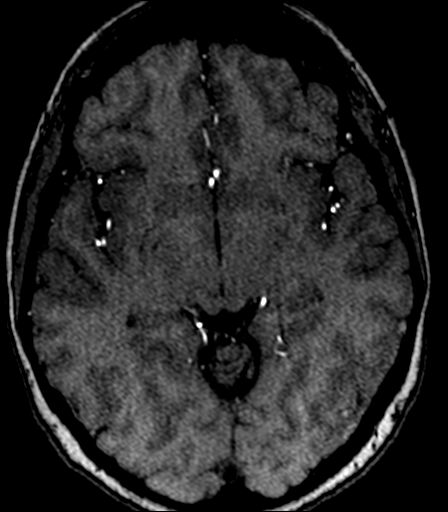
[im 78/136]
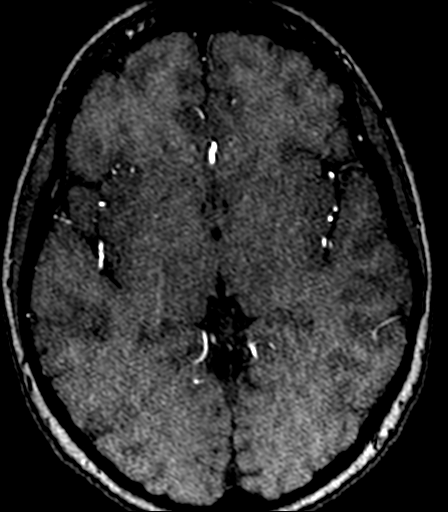
[im 95/136]
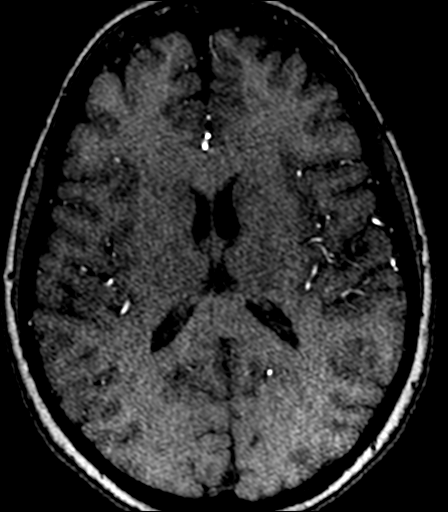
[im 113/136]
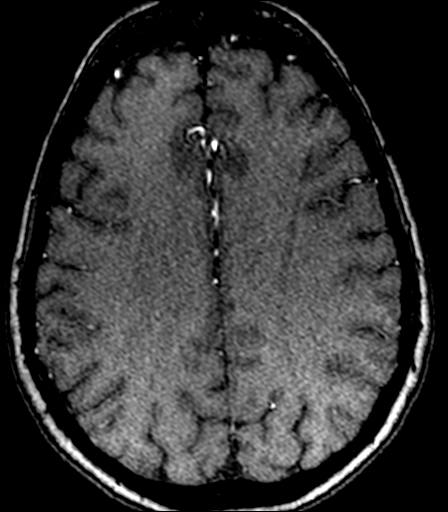
[im 115/136]
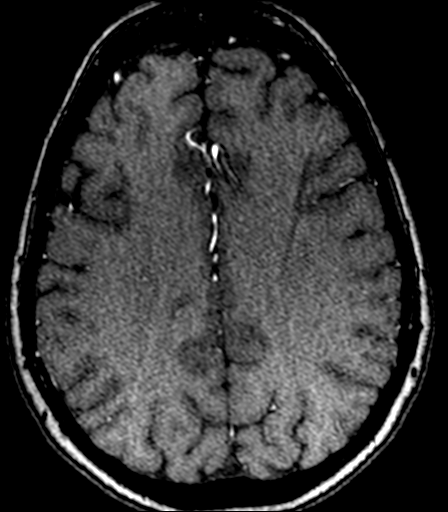
[im 130/136]
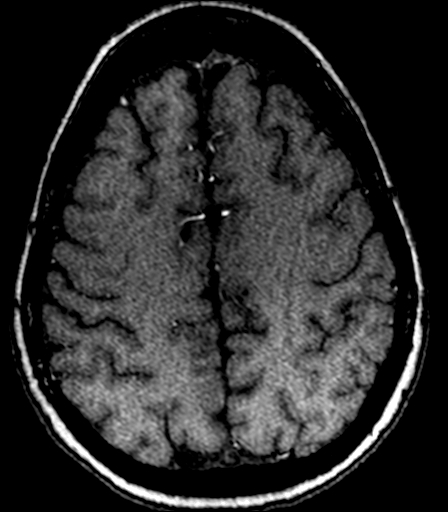

[23 of 48 positions shown; findings below may reference images not displayed]

FINDINGS: MRI HEAD FINDINGS

Brain: No acute infarction, hemorrhage, hydrocephalus, extra-axial
collection or mass lesion.

Vascular: See below.

Skull and upper cervical spine: Normal marrow signal.

Sinuses/Orbits: Clear sinuses.  Unremarkable orbits.

Other: No mastoid effusions.

MRA HEAD FINDINGS

Mildly motion limited study.

Anterior circulation: Intracranial ICA, MCAs, and ACAs are patent
without proximal high-grade stenosis. No aneurysm identified.

Posterior circulation: Visualized intradural vertebral arteries,
basilar artery, and posterior cerebral arteries are patent without
proximal hemodynamically significant stenosis. No aneurysm
identified.
IMPRESSION: 1. No acute intracranial abnormality.
2. No large vessel occlusion or proximal hemodynamically significant
stenosis.

## 2023-07-20 ENCOUNTER — Ambulatory Visit: Payer: Self-pay | Admitting: Family Medicine

## 2023-08-04 ENCOUNTER — Other Ambulatory Visit: Payer: Self-pay | Admitting: Family Medicine

## 2023-08-04 NOTE — Telephone Encounter (Signed)
Prescription Request  08/04/2023  LOV: Visit date not found  What is the name of the medication or equipment?  LORazepam (ATIVAN) 0.5 MG tablet   Have you contacted your pharmacy to request a refill? No   Which pharmacy would you like this sent to?  [id number] Vilinda Boehringer, [place] - [address] AT [facility] OF [facility] & [address] [place] [address] Phone: [phone] Fax: [phone]    Patient notified that their request is being sent to the clinical staff for review and that they should receive a response within 2 business days.   Please advise at Mobile [phone] (mobile)

## 2023-08-05 MED ORDER — LORAZEPAM 0.5 MG PO TABS: 0.5000 mg | ORAL_TABLET | Freq: Three times a day (TID) | ORAL | 0 refills | Status: AC | PRN

## 2023-08-06 ENCOUNTER — Other Ambulatory Visit: Payer: Self-pay | Admitting: Family Medicine

## 2023-08-06 DIAGNOSIS — F411 Generalized anxiety disorder: Secondary | ICD-10-CM

## 2023-09-05 ENCOUNTER — Other Ambulatory Visit: Payer: Self-pay | Admitting: Family Medicine

## 2023-09-05 DIAGNOSIS — F411 Generalized anxiety disorder: Secondary | ICD-10-CM

## 2023-10-05 ENCOUNTER — Other Ambulatory Visit: Payer: Self-pay | Admitting: Family Medicine

## 2023-10-05 DIAGNOSIS — F411 Generalized anxiety disorder: Secondary | ICD-10-CM

## 2023-11-02 ENCOUNTER — Other Ambulatory Visit: Payer: Self-pay | Admitting: Family Medicine

## 2023-11-02 DIAGNOSIS — F411 Generalized anxiety disorder: Secondary | ICD-10-CM

## 2023-11-03 ENCOUNTER — Telehealth: Payer: Self-pay | Admitting: *Deleted

## 2023-11-03 DIAGNOSIS — F411 Generalized anxiety disorder: Secondary | ICD-10-CM

## 2023-11-03 MED ORDER — ESCITALOPRAM OXALATE 10 MG PO TABS: ORAL_TABLET | ORAL | 0 refills | Status: DC

## 2023-11-03 NOTE — Telephone Encounter (Signed)
 Copied from CRM [id number]. Topic: Clinical - Medication Question >> Nov 03, 2023 10:47 AM Drema MATSU wrote: Reason for CRM: Patient wants to know if she can get a refill on escitalopram  (LEXAPRO ) 10 MG tablet now that she has an appointment scheduled for 11/08/2023. Patient is requesting a callback.

## 2023-11-03 NOTE — Telephone Encounter (Signed)
 Rx sent

## 2023-11-08 ENCOUNTER — Ambulatory Visit (INDEPENDENT_AMBULATORY_CARE_PROVIDER_SITE_OTHER): Payer: Commercial Managed Care - PPO | Admitting: Family Medicine

## 2023-11-08 ENCOUNTER — Encounter: Payer: Self-pay | Admitting: Family Medicine

## 2023-11-08 DIAGNOSIS — F411 Generalized anxiety disorder: Secondary | ICD-10-CM

## 2023-11-08 MED ORDER — ESCITALOPRAM OXALATE 10 MG PO TABS: ORAL_TABLET | ORAL | 3 refills | Status: DC

## 2023-11-08 NOTE — Progress Notes (Signed)
 Established Patient Office Visit  Subjective   Patient ID: Kristin Parks, female    DOB: [birth year]  Age: 38 y.o. MRN: [id number]  Chief Complaint  Patient presents with   Medication Refill    HPI   Tashaya is here for follow-up regarding chronic anxiety issues.  She has history of panic disorder and presumed generalized anxiety.  She has also had some depression in the past.  She feels like her anxiety and depression symptoms are very stable currently on escitalopram  10 mg daily.  No side effects.    Her parents, who used to be patient's of mine and recently moved to [place] .  Deniss has 85 year old daughter.    Past Medical History:  Diagnosis Date   ANEMIA-IRON  DEFICIENCY 10/23/2009   DEPRESSION 07/08/2010   High risk HPV infection    PANIC DISORDER 10/23/2009   Past Surgical History:  Procedure Laterality Date   CESAREAN SECTION  11/19/2011   Procedure: CESAREAN SECTION;  Surgeon: Dickie DELENA Carder, MD;  Location: [facility];  Service: Gynecology;  Laterality: N/A;  Primary   MOUTH SURGERY     WISDOM TOOTH EXTRACTION      reports that she has never smoked. She has never used smokeless tobacco. She reports current alcohol use. She reports that she does not use drugs. family history includes Arthritis in her maternal grandfather, maternal grandmother, and mother; Colon cancer in her maternal grandfather; Hypertension in her maternal grandfather, maternal grandmother, paternal grandfather, and paternal grandmother. Allergies  Allergen Reactions   Codeine Itching    Review of Systems  Constitutional:  Negative for malaise/fatigue.  Eyes:  Negative for blurred vision.  Respiratory:  Negative for shortness of breath.   Cardiovascular:  Negative for chest pain.  Neurological:  Negative for dizziness, weakness and headaches.      Objective:     BP 124/80 (BP Location: Left Arm, Patient Position: Sitting, Cuff Size: Normal)   Pulse 63   Temp 98.2 F (36.8 C) (Oral)    Ht 5' 5 (1.651 m)   Wt 154 lb 11.2 oz (70.2 kg)   LMP 10/18/2023 (Approximate)   SpO2 96%   BMI 25.74 kg/m  BP Readings from Last 3 Encounters:  11/08/23 124/80  07/28/22 114/80  08/19/21 130/80   Wt Readings from Last 3 Encounters:  11/08/23 154 lb 11.2 oz (70.2 kg)  07/28/22 143 lb 12.8 oz (65.2 kg)  08/19/21 154 lb (69.9 kg)      Physical Exam Vitals reviewed.  Constitutional:      General: She is not in acute distress.    Appearance: Normal appearance.  Cardiovascular:     Rate and Rhythm: Normal rate and regular rhythm.     Heart sounds: No murmur heard. Pulmonary:     Effort: Pulmonary effort is normal.     Breath sounds: Normal breath sounds. No wheezing or rales.  Neurological:     Mental Status: She is alert.  Psychiatric:        Mood and Affect: Mood normal.        Thought Content: Thought content normal.      No results found for any visits on 11/08/23.    The ASCVD Risk score (Arnett DK, et al., 2019) failed to calculate for the following reasons:   The 2019 ASCVD risk score is only valid for ages 47 to 66    Assessment & Plan:   Problem List Items Addressed This Visit   None Visit Diagnoses  Anxiety, generalized       Relevant Medications   escitalopram  (LEXAPRO ) 10 MG tablet     Anxiety symptoms stable on Lexapro .  Refill for 1 year  No follow-ups on file.    Wolm Scarlet, MD

## 2024-11-27 ENCOUNTER — Other Ambulatory Visit: Payer: Self-pay | Admitting: Family Medicine

## 2024-11-27 DIAGNOSIS — F411 Generalized anxiety disorder: Secondary | ICD-10-CM
